# Patient Record
Sex: Male | Born: 1950 | Race: Black or African American | Hispanic: No | Marital: Married | Smoking: Never smoker
Health system: Southern US, Community
[De-identification: ages and names within clinical notes are randomized; demographics above are authoritative.]

## PROBLEM LIST (undated history)

## (undated) DIAGNOSIS — H35019 Changes in retinal vascular appearance, unspecified eye: Secondary | ICD-10-CM

## (undated) DIAGNOSIS — E278 Other specified disorders of adrenal gland: Secondary | ICD-10-CM

## (undated) DIAGNOSIS — E119 Type 2 diabetes mellitus without complications: Secondary | ICD-10-CM

## (undated) DIAGNOSIS — M5126 Other intervertebral disc displacement, lumbar region: Secondary | ICD-10-CM

## (undated) DIAGNOSIS — M129 Arthropathy, unspecified: Secondary | ICD-10-CM

## (undated) DIAGNOSIS — K5730 Diverticulosis of large intestine without perforation or abscess without bleeding: Secondary | ICD-10-CM

## (undated) DIAGNOSIS — E559 Vitamin D deficiency, unspecified: Secondary | ICD-10-CM

## (undated) DIAGNOSIS — K219 Gastro-esophageal reflux disease without esophagitis: Secondary | ICD-10-CM

## (undated) DIAGNOSIS — I152 Hypertension secondary to endocrine disorders: Secondary | ICD-10-CM

## (undated) DIAGNOSIS — E785 Hyperlipidemia, unspecified: Secondary | ICD-10-CM

## (undated) DIAGNOSIS — I509 Heart failure, unspecified: Secondary | ICD-10-CM

## (undated) DIAGNOSIS — M109 Gout, unspecified: Secondary | ICD-10-CM

## (undated) DIAGNOSIS — E669 Obesity, unspecified: Secondary | ICD-10-CM

## (undated) DIAGNOSIS — I861 Scrotal varices: Secondary | ICD-10-CM

## (undated) DIAGNOSIS — N183 Chronic kidney disease, stage 3 unspecified: Secondary | ICD-10-CM

## (undated) DIAGNOSIS — D649 Anemia, unspecified: Secondary | ICD-10-CM

## (undated) DIAGNOSIS — E269 Hyperaldosteronism, unspecified: Secondary | ICD-10-CM

## (undated) DIAGNOSIS — H908 Mixed conductive and sensorineural hearing loss, unspecified: Secondary | ICD-10-CM

## (undated) DIAGNOSIS — N400 Enlarged prostate without lower urinary tract symptoms: Secondary | ICD-10-CM

## (undated) DIAGNOSIS — M4802 Spinal stenosis, cervical region: Secondary | ICD-10-CM

## (undated) HISTORY — DX: Chronic kidney disease, stage 3 unspecified: N18.30

## (undated) HISTORY — DX: Gastro-esophageal reflux disease without esophagitis: K21.9

## (undated) HISTORY — DX: Gout, unspecified: M10.9

## (undated) HISTORY — DX: Spinal stenosis, cervical region: M48.02

## (undated) HISTORY — DX: Benign prostatic hyperplasia without lower urinary tract symptoms: N40.0

## (undated) HISTORY — DX: Other intervertebral disc displacement, lumbar region: M51.26

## (undated) HISTORY — DX: Hyperaldosteronism, unspecified: E26.9

## (undated) HISTORY — DX: Anemia, unspecified: D64.9

## (undated) HISTORY — DX: Scrotal varices: I86.1

## (undated) HISTORY — DX: Chronic kidney disease, stage 3 (moderate): N18.3

## (undated) HISTORY — DX: Mixed conductive and sensorineural hearing loss, unspecified: H90.8

## (undated) HISTORY — DX: Arthropathy, unspecified: M12.9

## (undated) HISTORY — DX: Other specified disorders of adrenal gland: E27.8

## (undated) HISTORY — DX: Hypertension secondary to endocrine disorders: I15.2

## (undated) HISTORY — DX: Vitamin D deficiency, unspecified: E55.9

## (undated) HISTORY — DX: Changes in retinal vascular appearance, unspecified eye: H35.019

## (undated) HISTORY — DX: Obesity, unspecified: E66.9

## (undated) HISTORY — DX: Diverticulosis of large intestine without perforation or abscess without bleeding: K57.30

## (undated) HISTORY — DX: Type 2 diabetes mellitus without complications: E11.9

## (undated) HISTORY — DX: Hyperlipidemia, unspecified: E78.5

---

## 1970-07-20 HISTORY — PX: TYMPANIC MEMBRANE REPAIR: SHX294

## 1987-07-21 HISTORY — PX: CARDIAC CATHETERIZATION: SHX172

## 1997-05-20 HISTORY — PX: ESOPHAGOGASTRODUODENOSCOPY: SHX1529

## 1998-02-08 ENCOUNTER — Encounter: Payer: Self-pay | Admitting: Family Medicine

## 1998-02-08 LAB — CONVERTED CEMR LAB: Blood Glucose, Fasting: 91 mg/dL

## 1998-05-24 ENCOUNTER — Ambulatory Visit (HOSPITAL_BASED_OUTPATIENT_CLINIC_OR_DEPARTMENT_OTHER): Admission: RE | Admit: 1998-05-24 | Discharge: 1998-05-24 | Payer: Self-pay | Admitting: Otolaryngology

## 1998-07-18 HISTORY — PX: FLEXIBLE SIGMOIDOSCOPY: SHX1649

## 2000-04-21 ENCOUNTER — Encounter: Payer: Self-pay | Admitting: Family Medicine

## 2000-09-07 HISTORY — PX: OTHER SURGICAL HISTORY: SHX169

## 2000-11-26 ENCOUNTER — Encounter: Payer: Self-pay | Admitting: Family Medicine

## 2000-12-07 HISTORY — PX: US ECHOCARDIOGRAPHY: HXRAD669

## 2000-12-14 ENCOUNTER — Ambulatory Visit (HOSPITAL_COMMUNITY): Admission: AD | Admit: 2000-12-14 | Discharge: 2000-12-14 | Payer: Self-pay | Admitting: Cardiology

## 2000-12-14 HISTORY — PX: CARDIAC CATHETERIZATION: SHX172

## 2001-04-29 ENCOUNTER — Ambulatory Visit (HOSPITAL_COMMUNITY): Admission: RE | Admit: 2001-04-29 | Discharge: 2001-04-29 | Payer: Self-pay | Admitting: Gastroenterology

## 2001-04-29 ENCOUNTER — Encounter: Payer: Self-pay | Admitting: Gastroenterology

## 2001-05-06 ENCOUNTER — Encounter (INDEPENDENT_AMBULATORY_CARE_PROVIDER_SITE_OTHER): Payer: Self-pay | Admitting: Gastroenterology

## 2001-05-06 HISTORY — PX: ESOPHAGOGASTRODUODENOSCOPY: SHX1529

## 2002-06-08 ENCOUNTER — Encounter: Payer: Self-pay | Admitting: Family Medicine

## 2002-06-08 LAB — CONVERTED CEMR LAB: Blood Glucose, Fasting: 101 mg/dL

## 2002-09-25 ENCOUNTER — Encounter: Payer: Self-pay | Admitting: Family Medicine

## 2002-09-25 LAB — CONVERTED CEMR LAB: Blood Glucose, Fasting: 90 mg/dL

## 2003-01-16 ENCOUNTER — Encounter: Payer: Self-pay | Admitting: Family Medicine

## 2003-01-16 LAB — CONVERTED CEMR LAB: Blood Glucose, Fasting: 90 mg/dL

## 2003-02-05 ENCOUNTER — Encounter: Payer: Self-pay | Admitting: Family Medicine

## 2003-02-05 LAB — CONVERTED CEMR LAB: Blood Glucose, Fasting: 96 mg/dL

## 2003-04-16 HISTORY — PX: OTHER SURGICAL HISTORY: SHX169

## 2003-05-16 HISTORY — PX: OTHER SURGICAL HISTORY: SHX169

## 2003-08-10 HISTORY — PX: OTHER SURGICAL HISTORY: SHX169

## 2003-09-17 ENCOUNTER — Encounter: Admission: RE | Admit: 2003-09-17 | Discharge: 2003-09-17 | Payer: Self-pay | Admitting: Specialist

## 2003-10-26 ENCOUNTER — Inpatient Hospital Stay (HOSPITAL_COMMUNITY): Admission: RE | Admit: 2003-10-26 | Discharge: 2003-10-28 | Payer: Self-pay | Admitting: Specialist

## 2003-10-26 HISTORY — PX: NECK SURGERY: SHX720

## 2004-01-17 HISTORY — PX: ROTATOR CUFF REPAIR: SHX139

## 2004-02-27 ENCOUNTER — Encounter: Payer: Self-pay | Admitting: Family Medicine

## 2004-02-27 LAB — CONVERTED CEMR LAB: Blood Glucose, Fasting: 131 mg/dL

## 2004-03-06 ENCOUNTER — Encounter: Payer: Self-pay | Admitting: Family Medicine

## 2004-03-06 LAB — CONVERTED CEMR LAB
Blood Glucose, Fasting: 118 mg/dL
Hgb A1c MFr Bld: 6.6 %

## 2004-04-08 ENCOUNTER — Encounter: Payer: Self-pay | Admitting: Family Medicine

## 2004-05-02 ENCOUNTER — Encounter: Payer: Self-pay | Admitting: Family Medicine

## 2004-05-02 LAB — CONVERTED CEMR LAB
Blood Glucose, Fasting: 103 mg/dL
PSA: 1.2 ng/mL
WBC, blood: 6.2 10*3/uL

## 2004-06-16 ENCOUNTER — Ambulatory Visit: Payer: Self-pay | Admitting: Family Medicine

## 2004-09-04 ENCOUNTER — Ambulatory Visit: Payer: Self-pay | Admitting: Family Medicine

## 2004-09-04 LAB — CONVERTED CEMR LAB
Blood Glucose, Fasting: 106 mg/dL
PSA: 1.13 ng/mL

## 2004-09-08 ENCOUNTER — Ambulatory Visit: Payer: Self-pay | Admitting: Family Medicine

## 2004-10-13 ENCOUNTER — Ambulatory Visit: Payer: Self-pay | Admitting: Family Medicine

## 2004-11-20 ENCOUNTER — Ambulatory Visit: Payer: Self-pay | Admitting: Family Medicine

## 2005-03-09 ENCOUNTER — Ambulatory Visit: Payer: Self-pay | Admitting: Family Medicine

## 2005-09-24 ENCOUNTER — Ambulatory Visit: Payer: Self-pay | Admitting: Family Medicine

## 2005-10-19 ENCOUNTER — Ambulatory Visit: Payer: Self-pay | Admitting: Internal Medicine

## 2005-10-19 ENCOUNTER — Encounter: Payer: Self-pay | Admitting: Family Medicine

## 2005-10-19 LAB — CONVERTED CEMR LAB: Blood Glucose, Fasting: 106 mg/dL

## 2005-10-19 IMAGING — CR DG CHEST 2V
2 series · 2 of 2 positions shown · non-contrast
Comparison: none

CLINICAL DATA: Pre-operative respiratory exam for foraminal stenosis. 
 TWO VIEW CHEST 
 The heart and mediastinum are normal except for early calcification in the thoracic aorta. The lungs are clear.   No effusions. No significant soft tissue or bony finding.  Minimal degenerative changes affect the thoracic spine. 
 IMPRESSION
 No active disease.

[view not recorded (1 of 2)]
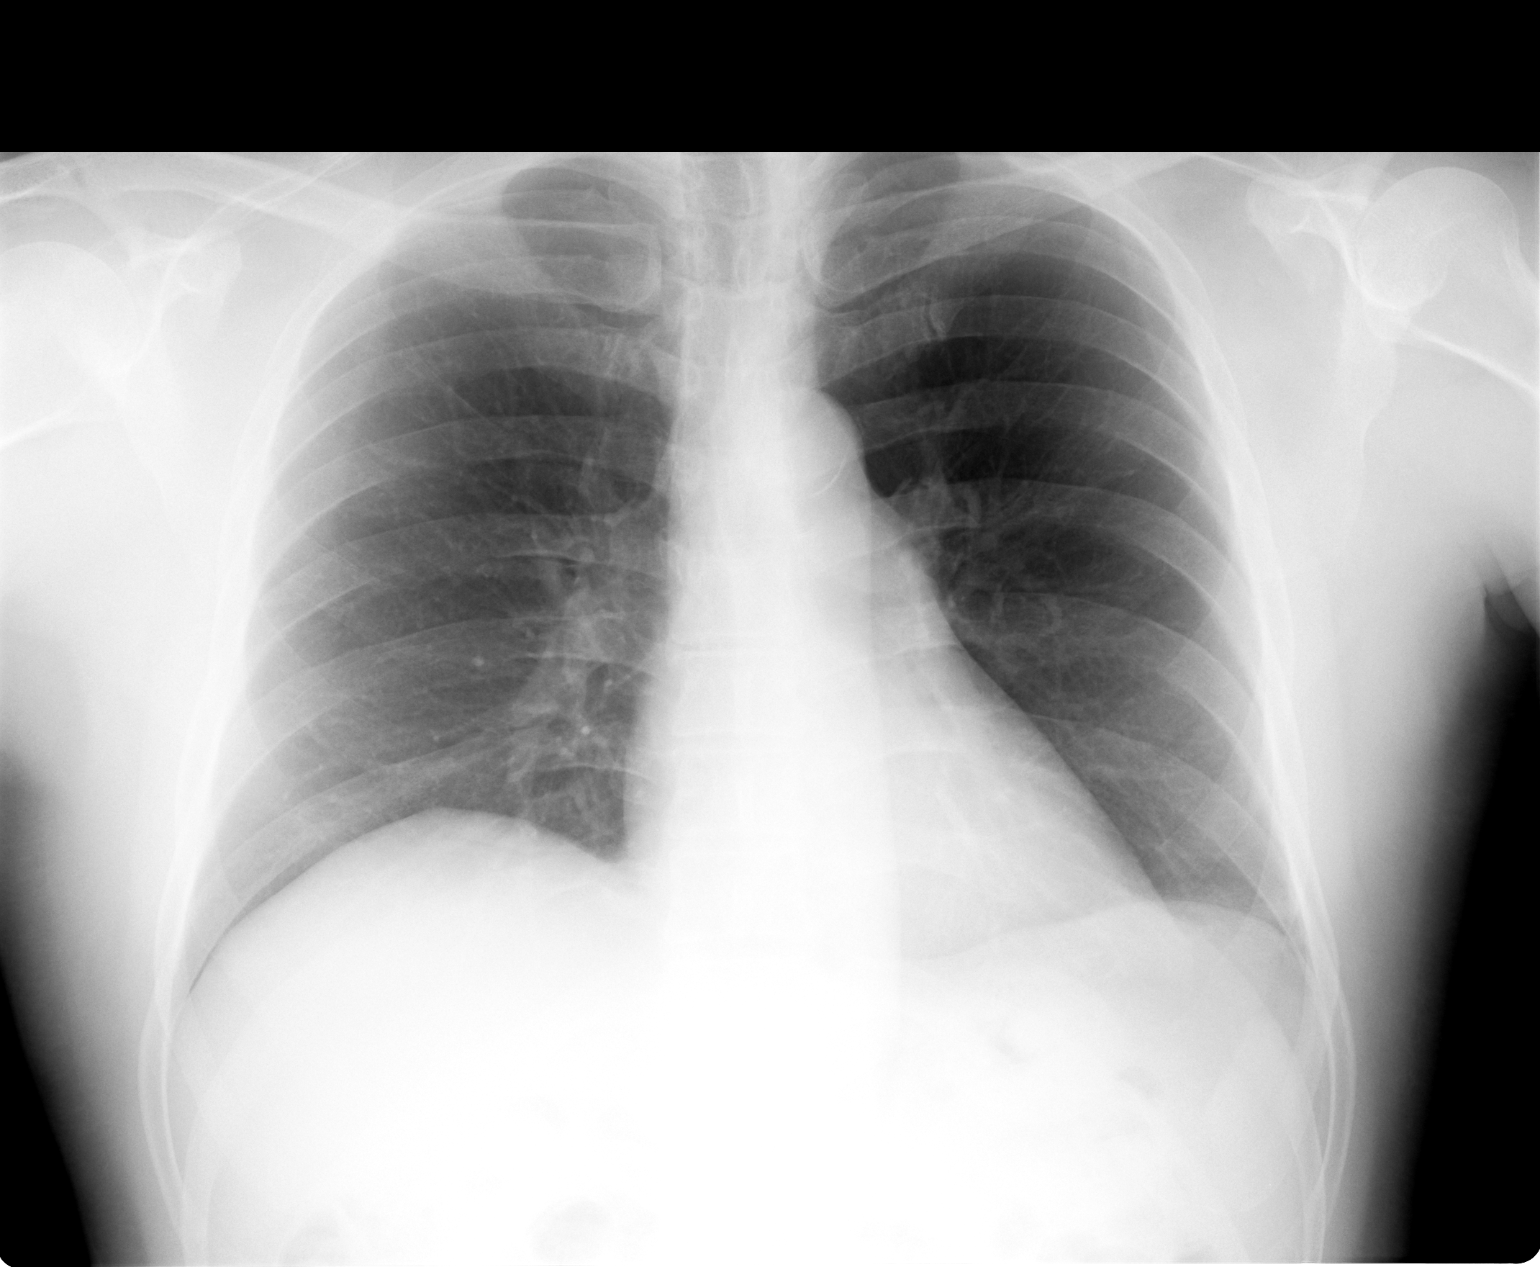

[view not recorded (2 of 2)]
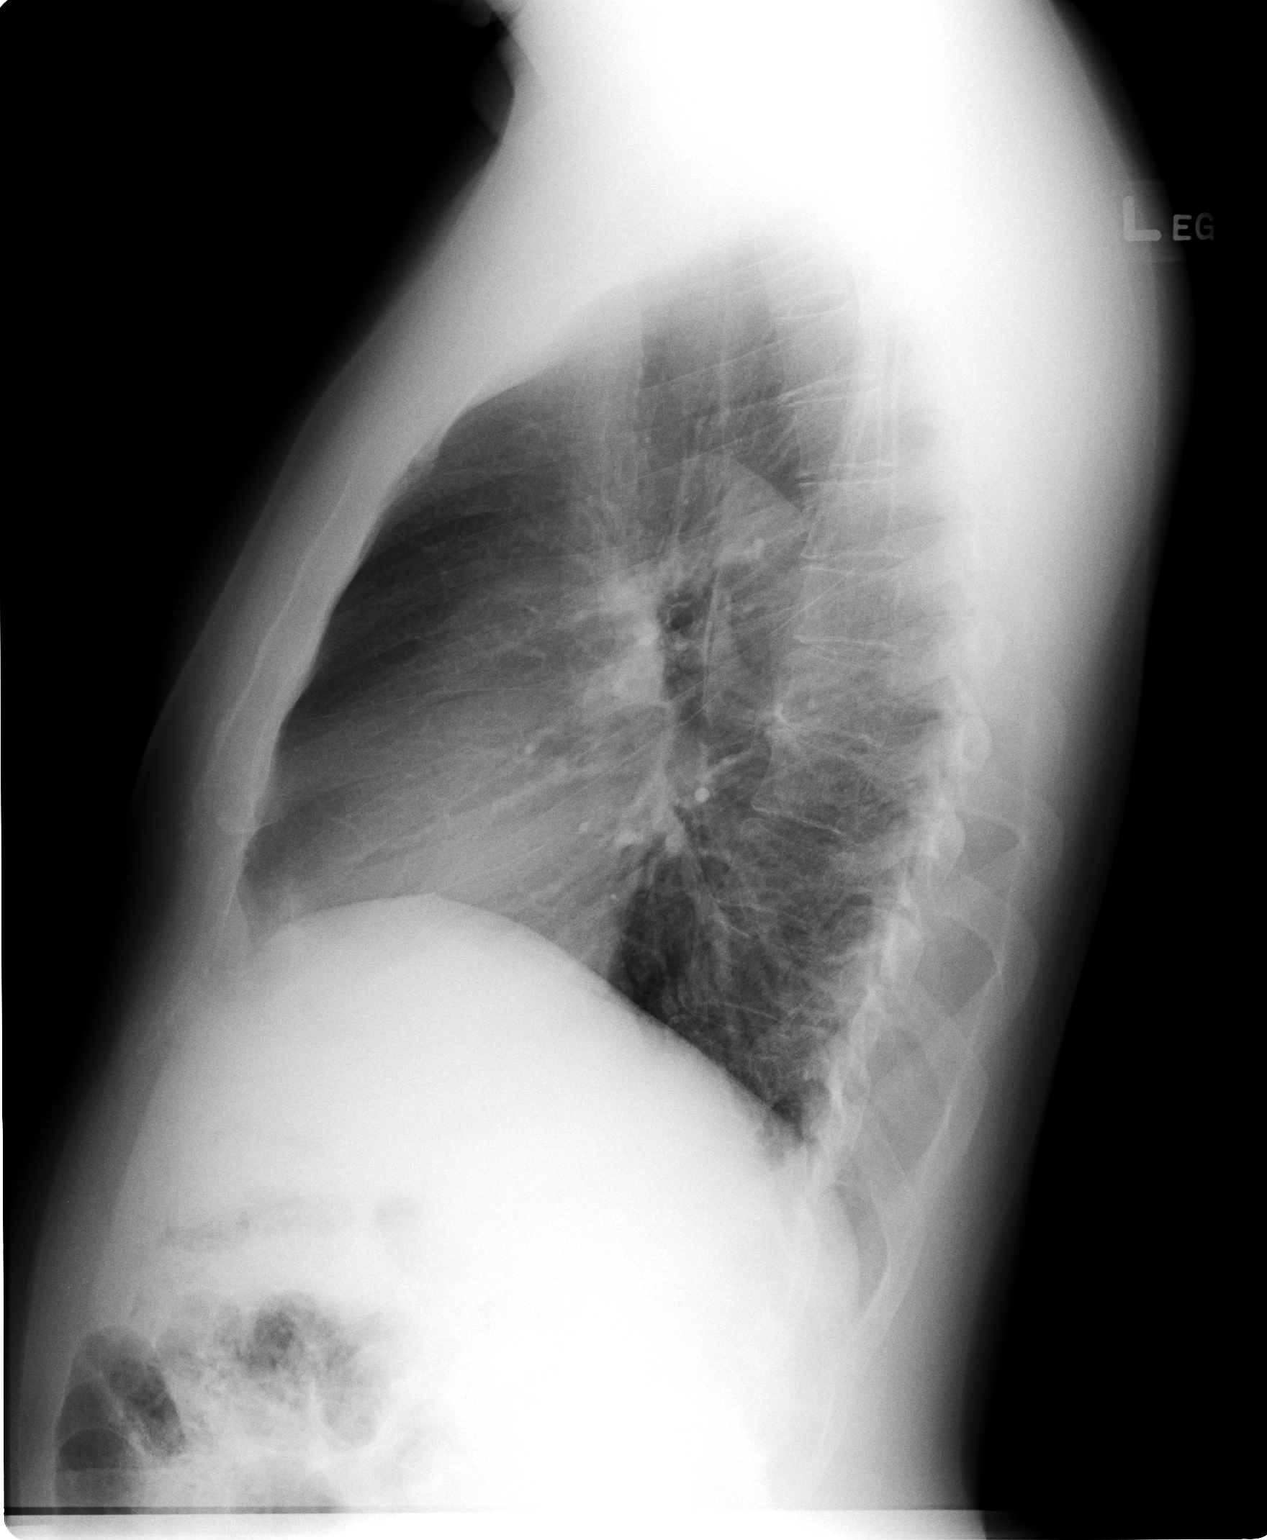

[2 of 2 positions shown; findings below may reference images not displayed]

## 2005-10-21 ENCOUNTER — Ambulatory Visit: Payer: Self-pay | Admitting: Cardiology

## 2005-10-21 HISTORY — PX: CARDIOVASCULAR STRESS TEST: SHX262

## 2005-10-28 ENCOUNTER — Ambulatory Visit: Payer: Self-pay | Admitting: Internal Medicine

## 2005-11-03 ENCOUNTER — Ambulatory Visit: Payer: Self-pay

## 2005-11-03 ENCOUNTER — Encounter: Payer: Self-pay | Admitting: Cardiology

## 2005-11-10 ENCOUNTER — Ambulatory Visit: Payer: Self-pay | Admitting: Internal Medicine

## 2005-11-16 ENCOUNTER — Ambulatory Visit: Payer: Self-pay | Admitting: Internal Medicine

## 2005-11-16 ENCOUNTER — Encounter: Payer: Self-pay | Admitting: Family Medicine

## 2005-11-17 ENCOUNTER — Ambulatory Visit (HOSPITAL_COMMUNITY): Admission: RE | Admit: 2005-11-17 | Discharge: 2005-11-17 | Payer: Self-pay | Admitting: Internal Medicine

## 2005-11-17 ENCOUNTER — Ambulatory Visit: Payer: Self-pay | Admitting: Internal Medicine

## 2005-12-03 ENCOUNTER — Ambulatory Visit: Payer: Self-pay | Admitting: Family Medicine

## 2005-12-17 ENCOUNTER — Ambulatory Visit: Payer: Self-pay | Admitting: Family Medicine

## 2005-12-18 ENCOUNTER — Ambulatory Visit: Payer: Self-pay | Admitting: Internal Medicine

## 2005-12-30 ENCOUNTER — Ambulatory Visit: Payer: Self-pay | Admitting: Internal Medicine

## 2006-01-08 ENCOUNTER — Ambulatory Visit: Payer: Self-pay | Admitting: Internal Medicine

## 2006-05-04 ENCOUNTER — Encounter: Payer: Self-pay | Admitting: Family Medicine

## 2006-05-04 LAB — CONVERTED CEMR LAB
Blood Glucose, Fasting: 86 mg/dL
Hgb A1c MFr Bld: 5.3 %

## 2006-05-06 ENCOUNTER — Ambulatory Visit: Payer: Self-pay | Admitting: Internal Medicine

## 2006-12-20 ENCOUNTER — Encounter: Payer: Self-pay | Admitting: Family Medicine

## 2006-12-20 LAB — CONVERTED CEMR LAB: Blood Glucose, Fasting: 101 mg/dL

## 2007-04-04 ENCOUNTER — Telehealth (INDEPENDENT_AMBULATORY_CARE_PROVIDER_SITE_OTHER): Payer: Self-pay | Admitting: *Deleted

## 2007-06-08 ENCOUNTER — Encounter: Payer: Self-pay | Admitting: Family Medicine

## 2007-06-21 ENCOUNTER — Encounter: Payer: Self-pay | Admitting: Family Medicine

## 2007-06-21 DIAGNOSIS — N259 Disorder resulting from impaired renal tubular function, unspecified: Secondary | ICD-10-CM | POA: Insufficient documentation

## 2007-06-22 DIAGNOSIS — E785 Hyperlipidemia, unspecified: Secondary | ICD-10-CM | POA: Insufficient documentation

## 2007-06-22 DIAGNOSIS — K5730 Diverticulosis of large intestine without perforation or abscess without bleeding: Secondary | ICD-10-CM | POA: Insufficient documentation

## 2007-06-22 DIAGNOSIS — H908 Mixed conductive and sensorineural hearing loss, unspecified: Secondary | ICD-10-CM | POA: Insufficient documentation

## 2007-06-22 DIAGNOSIS — E1122 Type 2 diabetes mellitus with diabetic chronic kidney disease: Secondary | ICD-10-CM | POA: Insufficient documentation

## 2007-06-22 DIAGNOSIS — K219 Gastro-esophageal reflux disease without esophagitis: Secondary | ICD-10-CM | POA: Insufficient documentation

## 2007-06-22 DIAGNOSIS — M129 Arthropathy, unspecified: Secondary | ICD-10-CM | POA: Insufficient documentation

## 2007-06-22 DIAGNOSIS — E119 Type 2 diabetes mellitus without complications: Secondary | ICD-10-CM | POA: Insufficient documentation

## 2007-06-22 DIAGNOSIS — N400 Enlarged prostate without lower urinary tract symptoms: Secondary | ICD-10-CM | POA: Insufficient documentation

## 2007-06-22 DIAGNOSIS — D649 Anemia, unspecified: Secondary | ICD-10-CM | POA: Insufficient documentation

## 2007-06-22 DIAGNOSIS — E278 Other specified disorders of adrenal gland: Secondary | ICD-10-CM | POA: Insufficient documentation

## 2007-06-22 DIAGNOSIS — R0609 Other forms of dyspnea: Secondary | ICD-10-CM | POA: Insufficient documentation

## 2007-06-22 DIAGNOSIS — R0989 Other specified symptoms and signs involving the circulatory and respiratory systems: Secondary | ICD-10-CM | POA: Insufficient documentation

## 2007-06-22 DIAGNOSIS — H35019 Changes in retinal vascular appearance, unspecified eye: Secondary | ICD-10-CM | POA: Insufficient documentation

## 2007-06-30 ENCOUNTER — Telehealth (INDEPENDENT_AMBULATORY_CARE_PROVIDER_SITE_OTHER): Payer: Self-pay | Admitting: *Deleted

## 2007-10-19 ENCOUNTER — Telehealth: Payer: Self-pay | Admitting: Family Medicine

## 2007-11-01 ENCOUNTER — Emergency Department (HOSPITAL_COMMUNITY): Admission: EM | Admit: 2007-11-01 | Discharge: 2007-11-01 | Payer: Self-pay | Admitting: Family Medicine

## 2007-11-03 ENCOUNTER — Encounter: Payer: Self-pay | Admitting: Family Medicine

## 2007-11-09 ENCOUNTER — Encounter: Admission: RE | Admit: 2007-11-09 | Discharge: 2007-11-09 | Payer: Self-pay | Admitting: Orthopedic Surgery

## 2007-11-16 ENCOUNTER — Encounter: Payer: Self-pay | Admitting: Family Medicine

## 2007-11-16 HISTORY — PX: OTHER SURGICAL HISTORY: SHX169

## 2007-11-23 ENCOUNTER — Encounter: Payer: Self-pay | Admitting: Family Medicine

## 2007-11-24 ENCOUNTER — Encounter: Payer: Self-pay | Admitting: Family Medicine

## 2007-11-25 ENCOUNTER — Encounter: Admission: RE | Admit: 2007-11-25 | Discharge: 2007-11-25 | Payer: Self-pay | Admitting: Orthopedic Surgery

## 2008-04-12 ENCOUNTER — Telehealth: Payer: Self-pay | Admitting: Family Medicine

## 2008-05-09 ENCOUNTER — Encounter: Payer: Self-pay | Admitting: Family Medicine

## 2008-07-09 ENCOUNTER — Ambulatory Visit: Payer: Self-pay | Admitting: Family Medicine

## 2008-07-09 LAB — CONVERTED CEMR LAB
Albumin: 4.1 g/dL (ref 3.5–5.2)
Alkaline Phosphatase: 72 units/L (ref 39–117)
Bilirubin, Direct: 0.1 mg/dL (ref 0.0–0.3)
Calcium: 9.4 mg/dL (ref 8.4–10.5)
Cholesterol: 157 mg/dL (ref 0–200)
Eosinophils Absolute: 0.2 10*3/uL (ref 0.0–0.7)
GFR calc Af Amer: 57 mL/min
GFR calc non Af Amer: 47 mL/min
Glucose, Bld: 94 mg/dL (ref 70–99)
HCT: 44.1 % (ref 39.0–52.0)
HDL: 49.9 mg/dL (ref >39.0)
Hemoglobin: 15.5 g/dL (ref 13.0–17.0)
Hgb A1c MFr Bld: 5.7 % (ref 4.6–6.0)
MCHC: 35.2 g/dL (ref 30.0–36.0)
MCV: 85.5 fL (ref 78.0–100.0)
Microalb Creat Ratio: 50.7 mg/g — ABNORMAL HIGH (ref 0.0–30.0)
Microalb, Ur: 6.8 mg/dL — ABNORMAL HIGH (ref 0.0–1.9)
Monocytes Absolute: 0.3 10*3/uL (ref 0.1–1.0)
Monocytes Relative: 7.5 % (ref 3.0–12.0)
Neutro Abs: 2.3 10*3/uL (ref 1.4–7.7)
Platelets: 178 10*3/uL (ref 150–400)
Potassium: 3.6 meq/L (ref 3.5–5.1)
RDW: 12.4 % (ref 11.5–14.6)
Sodium: 142 meq/L (ref 135–145)
Total CHOL/HDL Ratio: 3.1
Total Protein: 6.6 g/dL (ref 6.0–8.3)
Triglycerides: 41 mg/dL (ref 0–149)

## 2008-07-17 ENCOUNTER — Ambulatory Visit: Payer: Self-pay | Admitting: Family Medicine

## 2008-07-17 DIAGNOSIS — I151 Hypertension secondary to other renal disorders: Secondary | ICD-10-CM

## 2008-07-17 DIAGNOSIS — E2609 Other primary hyperaldosteronism: Secondary | ICD-10-CM | POA: Insufficient documentation

## 2008-08-08 ENCOUNTER — Ambulatory Visit: Payer: Self-pay | Admitting: Gastroenterology

## 2008-10-25 ENCOUNTER — Encounter: Admission: RE | Admit: 2008-10-25 | Discharge: 2008-10-25 | Payer: Self-pay | Admitting: Specialist

## 2009-01-11 ENCOUNTER — Ambulatory Visit: Payer: Self-pay | Admitting: Family Medicine

## 2009-01-11 LAB — CONVERTED CEMR LAB
BUN: 23 mg/dL (ref 6–23)
Chloride: 110 meq/L (ref 96–112)
Creatinine, Ser: 1.4 mg/dL (ref 0.4–1.5)
Glucose, Bld: 123 mg/dL — ABNORMAL HIGH (ref 70–99)
Potassium: 3.4 meq/L — ABNORMAL LOW (ref 3.5–5.1)

## 2009-01-16 ENCOUNTER — Ambulatory Visit: Payer: Self-pay | Admitting: Family Medicine

## 2009-01-16 DIAGNOSIS — E876 Hypokalemia: Secondary | ICD-10-CM | POA: Insufficient documentation

## 2009-01-17 LAB — CONVERTED CEMR LAB
BUN: 21 mg/dL (ref 6–23)
CO2: 29 meq/L (ref 19–32)
Calcium: 9.5 mg/dL (ref 8.4–10.5)
Chloride: 106 meq/L (ref 96–112)
Creatinine, Ser: 1.4 mg/dL (ref 0.4–1.5)
Glucose, Bld: 96 mg/dL (ref 70–99)

## 2009-02-22 ENCOUNTER — Ambulatory Visit: Payer: Self-pay | Admitting: Gastroenterology

## 2009-06-03 ENCOUNTER — Encounter: Payer: Self-pay | Admitting: Family Medicine

## 2009-07-11 ENCOUNTER — Ambulatory Visit: Payer: Self-pay | Admitting: Family Medicine

## 2009-07-11 DIAGNOSIS — M1990 Unspecified osteoarthritis, unspecified site: Secondary | ICD-10-CM | POA: Insufficient documentation

## 2009-07-13 LAB — CONVERTED CEMR LAB
BUN: 28 mg/dL — ABNORMAL HIGH (ref 6–23)
Basophils Relative: 0 % (ref 0–1)
CO2: 23 meq/L (ref 19–32)
Eosinophils Absolute: 0.1 10*3/uL (ref 0.0–0.7)
Eosinophils Relative: 3 % (ref 0–5)
Glucose, Bld: 94 mg/dL (ref 70–99)
HCT: 43.3 % (ref 39.0–52.0)
Hemoglobin: 15.1 g/dL (ref 13.0–17.0)
Iron: 96 ug/dL (ref 42–165)
MCHC: 34.9 g/dL (ref 30.0–36.0)
MCV: 82.2 fL (ref 78.0–100.0)
Monocytes Absolute: 0.4 10*3/uL (ref 0.1–1.0)
Monocytes Relative: 8 % (ref 3–12)
Neutro Abs: 2.9 10*3/uL (ref 1.7–7.7)
PSA: 2.9 ng/mL (ref 0.10–4.00)
RBC: 5.27 M/uL (ref 4.22–5.81)
Sodium: 142 meq/L (ref 135–145)
TSH: 0.75 microintl units/mL (ref 0.350–4.500)
Total Bilirubin: 0.5 mg/dL (ref 0.3–1.2)
Total Protein: 6.7 g/dL (ref 6.0–8.3)

## 2009-07-18 ENCOUNTER — Ambulatory Visit: Payer: Self-pay | Admitting: Family Medicine

## 2009-07-18 DIAGNOSIS — E559 Vitamin D deficiency, unspecified: Secondary | ICD-10-CM | POA: Insufficient documentation

## 2009-07-29 ENCOUNTER — Telehealth: Payer: Self-pay | Admitting: Family Medicine

## 2009-08-01 ENCOUNTER — Encounter: Payer: Self-pay | Admitting: Family Medicine

## 2009-08-07 ENCOUNTER — Encounter: Payer: Self-pay | Admitting: Family Medicine

## 2009-08-07 LAB — CONVERTED CEMR LAB
HDL: 42 mg/dL (ref >39)
LDL Cholesterol: 135 mg/dL — ABNORMAL HIGH (ref 0–99)
Total CHOL/HDL Ratio: 4.5

## 2009-08-09 ENCOUNTER — Telehealth: Payer: Self-pay | Admitting: Family Medicine

## 2009-08-15 ENCOUNTER — Ambulatory Visit: Payer: Self-pay | Admitting: Family Medicine

## 2009-08-15 LAB — CONVERTED CEMR LAB: OCCULT 1: NEGATIVE

## 2009-08-19 ENCOUNTER — Encounter (INDEPENDENT_AMBULATORY_CARE_PROVIDER_SITE_OTHER): Payer: Self-pay | Admitting: *Deleted

## 2009-10-14 ENCOUNTER — Ambulatory Visit: Payer: Self-pay | Admitting: Family Medicine

## 2009-10-17 ENCOUNTER — Ambulatory Visit: Payer: Self-pay | Admitting: Family Medicine

## 2009-11-20 ENCOUNTER — Telehealth: Payer: Self-pay | Admitting: Family Medicine

## 2010-01-13 ENCOUNTER — Ambulatory Visit: Payer: Self-pay | Admitting: Family Medicine

## 2010-01-16 ENCOUNTER — Ambulatory Visit: Payer: Self-pay | Admitting: Family Medicine

## 2010-02-25 ENCOUNTER — Encounter (INDEPENDENT_AMBULATORY_CARE_PROVIDER_SITE_OTHER): Payer: Self-pay | Admitting: *Deleted

## 2010-06-02 ENCOUNTER — Encounter: Payer: Self-pay | Admitting: Family Medicine

## 2010-08-21 NOTE — Assessment & Plan Note (Signed)
Summary: F/U AFTER LABS / LFW   Vital Signs:  Patient profile:   60 year old male Weight:      183 pounds Temp:     98.1 degrees F oral Pulse rate:   60 / minute Pulse rhythm:   regular BP sitting:   118 / 72  (right arm) Cuff size:   large  Vitals Entered By: Sydell Axon LPN (January 16, 2010 10:45 AM) CC: 3 Month follow-up after labs   History of Present Illness: Pt here for three month followup, hasn't taken any Vit D for a while, did not start OTC Vit D yet asnd was 46 on Weekly.  He has been on healthy diet latley and has lost significant weight. His sugar control has been coerrespondingly great!! He is on no meds. He has no complaints today and feels well except for excessively dry lips.  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 1 By Mouth Daily 2)  Diltiazem Hcl Cr 240 Mg  Cp24 (Diltiazem Hcl) .Marland Kitchen.. 1 By Mouth Daily 3)  Lisinopril 20 Mg  Tabs (Lisinopril) .Marland Kitchen.. 1 By Mouth Two Times A Day 4)  Hydrochlorothiazide 25 Mg  Tabs (Hydrochlorothiazide) .... 1/2  Tablet By Mouth Daily 5)  Viagra 100 Mg  Tabs (Sildenafil Citrate) .Marland Kitchen.. 1  Tab By Mouth 1hr Prior 6)  Adult Aspirin Low Strength 81 Mg  Tbdp (Aspirin) .... Take One By Mouth Daily 7)  Multivitamins   Tabs (Multiple Vitamin) .Marland Kitchen.. 1 By Mouth Daily 8)  Tylenol 9)  Inspra 50 Mg Tabs (Eplerenone) .... 1/2 Tablet Daily By Mouth 10)  Ergocalciferol 50000 Unit Caps (Ergocalciferol) .... One Tab By Mouth Once A Week 11)  Crestor 20 Mg Tabs (Rosuvastatin Calcium) .... Take One By Mouth Daily  Allergies: 1)  ! * Dillaudid 2)  ! Oxycodone Hcl  Physical Exam  General:  Well-developed,well-nourished,in no acute distress; alert,appropriate and cooperative throughout examination, looks good, thinner. Head:  Normocephalic and atraumatic without obvious abnormalities. No apparent alopecia, mild thinning.  Sinuises NT. Eyes:  Conjunctiva clear bilaterally.  Ears:  External ear exam shows no significant lesions or deformities.  Otoscopic examination reveals clear canals, tympanic membranes are intact bilaterally without bulging, retraction, inflammation or discharge. Hearing is grossly normal bilaterally. Nose:  External nasal examination shows no deformity or inflammation. Nasal mucosa are pink and moist without lesions or exudates. Mouth:  Oral mucosa and oropharynx without lesions or exudates.  Teeth in good repair. Neck:  No deformities, masses, or tenderness noted. Lungs:  Normal respiratory effort, chest expands symmetrically. Lungs are clear to auscultation, no crackles or wheezes. Heart:  Normal rate and regular rhythm. S1 and S2 normal without gallop, murmur, click, rub or other extra sounds.   Impression & Recommendations:  Problem # 1:  VITAMIN D DEFICIENCY (ICD-268.9) Assessment Deteriorated Lvl has gone down but still therapeutic. Start Vit D 1000Iu two times a day. Recheck in future.  Problem # 2:  DIABETES MELLITUS, TYPE II (ICD-250.00) Assessment: Improved Great job. Cont curr  lifestyle. His updated medication list for this problem includes:    Lisinopril 20 Mg Tabs (Lisinopril) .Marland Kitchen... 1 by mouth two times a day    Adult Aspirin Low Strength 81 Mg Tbdp (Aspirin) .Marland Kitchen... Take one by mouth daily  Labs Reviewed: Creat: 1.52 (07/11/2009)    Reviewed HgBA1c results: 5.6 (01/13/2010)  6.1 (07/11/2009)  Complete Medication List: 1)  Nexium 40 Mg Cpdr (Esomeprazole magnesium) .Marland Kitchen.. 1 by mouth daily 2)  Diltiazem Hcl Cr 240 Mg Cp24 (Diltiazem hcl) .Marland Kitchen.. 1 by mouth daily 3)  Lisinopril 20 Mg Tabs (Lisinopril) .Marland Kitchen.. 1 by mouth two times a day 4)  Hydrochlorothiazide 25 Mg Tabs (Hydrochlorothiazide) .... 1/2 tablet by mouth daily 5)  Viagra 100 Mg Tabs (Sildenafil citrate) .Marland Kitchen.. 1  tab by mouth 1hr prior 6)  Adult Aspirin Low Strength 81 Mg Tbdp (Aspirin) .... Take one by mouth daily 7)  Multivitamins Tabs (Multiple vitamin) .Marland Kitchen.. 1 by mouth daily 8)  Tylenol  9)  Inspra 50 Mg Tabs (Eplerenone) .... 1/2 tablet daily by mouth 10)  Ergocalciferol 50000 Unit Caps (Ergocalciferol) .... One tab by mouth once a week 11)  Crestor 20 Mg Tabs (Rosuvastatin calcium) .... Take one by mouth daily  Patient Instructions: 1)  RTC end of Dec/beginning of Jan for Comp Exam, sooner as needed.  Current Allergies (reviewed today): ! * DILLAUDID ! OXYCODONE HCL

## 2010-08-21 NOTE — Letter (Signed)
Summary: [facility]  [facility]   Imported By: Lanelle Bal 06/19/2010 11:59:39  _____________________________________________________________________  External Attachment:    Type:   Image     Comment:   External Document

## 2010-08-21 NOTE — Progress Notes (Signed)
Summary: refill on meds   Phone Note Refill Request Call back at [phone] Message [facility]:  [facility] on July 29, 2009 10:44 AM  Refills Requested: Medication #1:  NEXIUM 40 MG  CPDR 1 by mouth daily  Medication #2:  CRESTOR 10 MG  TABS 1 by mouth daily   Last Refilled: 04/20/2009  Medication #3:  INSPRA 50 MG TABS 1/2 tablet daily by mouth  Medication #4:  LISINOPRIL 20 MG  TABS 1 by mouth two times a day   Last Refilled: 04/20/2009 DILTIAZEM HCL ER 240 MG  CP24 (DILTIAZEM HCL) 1 by mouth daily  #90 x 3   Hydrochlorothiazide 25 Mg Tabs (Hydrochlorothiazide) .Marland Kitchen... 1/2 tablet by mouth daily [facility] [address] [address]    Method Requested: Electronic Initial call taken by: Melody Comas,  July 29, 2009 9:48 AM    Prescriptions: HYDROCHLOROTHIAZIDE 25 MG  TABS (HYDROCHLOROTHIAZIDE) 1/2 TABLET by mouth daily  #30 x 0   Entered by:   Ruthe Mannan MD   Authorized by:   Shaune Leeks MD   Signed by:   Ruthe Mannan MD on 07/29/2009   Method used:   Electronically to        [facility] [phone]* (retail)       [address] [place], [place]  [address]       Ph: [phone]       Fax: [phone]   RxID:   [id number] LISINOPRIL 20 MG  TABS (LISINOPRIL) 1 by mouth two times a day  #60 x 0   Entered by:   Ruthe Mannan MD   Authorized by:   Shaune Leeks MD   Signed by:   Ruthe Mannan MD on 07/29/2009   Method used:   Electronically to        [id number] (retail)       [address] [place], [place]  [address]       Ph: [phone]       Fax: [phone]   RxID:   [id number] DILTIAZEM HCL CR 240 MG  CP24 (DILTIAZEM HCL) 1 by mouth daily  #90 x 0   Entered by:   Ruthe Mannan MD   Authorized by:   Shaune Leeks MD   Signed by:   Ruthe Mannan MD on 07/29/2009   Method used:   Electronically to        [facility] [address] (retail)       [address] [place], [place]  [address]       Ph:  [phone]       Fax: [phone]   RxID:   [id number]

## 2010-08-21 NOTE — Progress Notes (Signed)
Summary: Rx Vitamin D  Phone Note Refill Request Call back at [phone] Message [facility]:  [facility] on Nov 20, 2009 9:50 AM  RECEIVED REFILL REQUEST FOR VITMAIN D 50.000.  LOOKS LIKE THIS MEDICATION WAS STOPPED AT OFFICE VISIT ON 10/17/09 AND SWITCHED TO OTC.  Please advise.  Initial call taken by: Sydell Axon LPN,  Nov 21, 4538 9:51 AM  Follow-up for Phone Call        This was indeed stopped. He is to be taking Vit D OTC 1000Iu once a day. Follow-up by: Shaune Leeks MD,  Nov 20, 2009 11:31 AM  Additional Follow-up for Phone Call Additional follow up Details #1::        Pharmacy notified as instructed. Additional Follow-up by: Sydell Axon LPN,  Nov 20, 9809 12:31 PM

## 2010-08-21 NOTE — Assessment & Plan Note (Signed)
Summary: 3 MONTH FOLLOW UP/[facility]   Vital Signs:  Patient profile:   60 year old male Weight:      192 pounds Temp:     98.4 degrees F oral Pulse rate:   60 / minute Pulse rhythm:   regular BP sitting:   120 / 76  (left arm) Cuff size:   regular  Vitals Entered By: Sydell Axon LPN (October 17, 2009 9:19 AM) CC: 3 Month follow-up after labs   History of Present Illness: Pt hwere for three month followup for checking Vit D replacement. He has tolerated the weekly dose without difficulty.  He has no complaints today and feels well. He spent yesterday at the [place] trying to get organized for a colonoscopy. Attitudes there amazed him.  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 1 By Mouth Daily 2)  Diltiazem Hcl Cr 240 Mg  Cp24 (Diltiazem Hcl) .Marland Kitchen.. 1 By Mouth Daily 3)  Lisinopril 20 Mg  Tabs (Lisinopril) .Marland Kitchen.. 1 By Mouth Two Times A Day 4)  Hydrochlorothiazide 25 Mg  Tabs (Hydrochlorothiazide) .... 1/2 Tablet By Mouth Daily 5)  Viagra 100 Mg  Tabs (Sildenafil Citrate) .Marland Kitchen.. 1   Tab By Mouth 1hr Prior 6)  Adult Aspirin Low Strength 81 Mg  Tbdp (Aspirin) .... Take One By Mouth Daily 7)  Multivitamins   Tabs (Multiple Vitamin) .Marland Kitchen.. 1 By Mouth Daily 8)  Tylenol 9)  Inspra 50 Mg Tabs (Eplerenone) .... 1/2 Tablet Daily By Mouth 10)  Ergocalciferol 50000 Unit Caps (Ergocalciferol) .... One Tab By Mouth Once A Week 11)  Crestor 20 Mg Tabs (Rosuvastatin Calcium) .... Take One By Mouth Daily  Allergies: 1)  ! * Dillaudid 2)  ! Oxycodone Hcl  Physical Exam  General:  Well-developed,well-nourished,in no acute distress; alert,appropriate and cooperative throughout examination, looks good, thinner. Head:  Normocephalic and atraumatic without obvious abnormalities. No apparent alopecia, mild thinning.  Eyes:  Conjunctiva clear bilaterally.  Ears:  External ear exam shows no significant lesions or deformities.  Otoscopic examination reveals clear canals, tympanic membranes are intact bilaterally without bulging, retraction, inflammation or discharge. Hearing is grossly normal bilaterally. Nose:  External nasal examination shows no deformity or inflammation. Nasal mucosa are pink and moist without lesions or exudates. Mouth:  Oral mucosa and oropharynx without lesions or exudates.  Teeth in good repair. Neck:  No deformities, masses, or tenderness noted. Lungs:  Normal respiratory effort, chest expands symmetrically. Lungs are clear to auscultation, no crackles or wheezes. Heart:  Normal rate and regular rhythm. S1 and S2 normal without gallop, murmur, click, rub or other extra sounds.   Impression &  Recommendations:  Problem # 1:  VITAMIN D DEFICIENCY (ICD-268.9) Assessment Improved Lvl from 12 to 46 in 3 mos of 50000Iu repl.  Change to OTC 1000Iu daily.  Problem # 2:  DIABETES MELLITUS, TYPE II (ICD-250.00) Assessment: Unchanged  Will recheck next time. His updated medication list for this problem includes:    Lisinopril 20 Mg Tabs (Lisinopril) .Marland Kitchen... 1 by mouth two  times a day    Adult Aspirin Low Strength 81 Mg Tbdp (Aspirin) .Marland Kitchen... Take one by mouth daily  Labs Reviewed: Creat: 1.52 (07/11/2009)    Reviewed HgBA1c results: 6.1 (07/11/2009)  5.9 (01/11/2009)  Problem # 3:  HEALTH MAINTENANCE EXAM (ICD-V70.0) Assessment: Comment Only Sickle Cell screen neg.  Complete Medication List: 1)  Nexium 40 Mg Cpdr (Esomeprazole magnesium) .Marland Kitchen.. 1 by mouth daily 2)  Diltiazem Hcl Cr 240 Mg Cp24 (Diltiazem hcl) .Marland Kitchen.. 1 by mouth daily 3)  Lisinopril 20 Mg Tabs (Lisinopril) .Marland Kitchen.. 1 by mouth two times a day 4)  Hydrochlorothiazide 25 Mg Tabs (Hydrochlorothiazide) .... 1/2 tablet by mouth daily 5)  Viagra 100 Mg Tabs (Sildenafil citrate) .Marland Kitchen.. 1  tab by mouth 1hr prior 6)  Adult Aspirin Low Strength 81 Mg Tbdp (Aspirin) .... Take one by mouth daily 7)  Multivitamins Tabs (Multiple vitamin) .Marland Kitchen.. 1 by mouth daily 8)  Tylenol  9)  Inspra 50 Mg Tabs (Eplerenone) .... 1/2 tablet daily by mouth 10)  Ergocalciferol 50000 Unit Caps (Ergocalciferol) .... One tab by mouth once a week 11)  Crestor 20 Mg Tabs (Rosuvastatin calcium) .... Take one by mouth daily  Patient Instructions: 1)  RTC 3 mos Vit D lvl 286.9. Will also check A1C. 250.00 2)  Start Vit D OTC 1000Iu daily. Prescriptions: LISINOPRIL 20 MG  TABS (LISINOPRIL) 1 by mouth two times a day  #90 x 3   Entered by:   Sydell Axon LPN   Authorized by:   Shaune Leeks MD   Signed by:   Sydell Axon LPN on 16/04/9603   Method used:   [facility] [phone][facility] (retail)       [address] [place], [place]  [address]       Ph: [phone]       Fax: [phone]   RxID:   [id number]   Current Allergies (reviewed today): ! * DILLAUDID ! OXYCODONE HCL

## 2010-08-21 NOTE — Letter (Signed)
Summary: Karl Robinson letter  [place] at [facility]  [address] [place], [place] [address]   Phone: [phone]  Fax: [phone]       02/25/2010 MRN: [id number]  Karl Robinson [address] [place], [place]  [address]  Dear Karl Robinson,  [facility] [place], and [facility] Robinson announce the retirement of Arta Silence, M.D., from full-time practice at the [facility] office effective January 16, 2010 and his plans of returning part-time.  It is important to Dr. Hetty Ely and to our practice that you understand that [facility] has seven physicians in our office for your health care needs.  We will continue to offer the same exceptional care that you have today.    Dr. Hetty Ely has spoken to many of you about his plans for retirement and returning part-time in the fall.   We will continue to work with you through the transition to schedule appointments for you in the office and meet the high standards that [facility] is committed to.   Again, it is with great pleasure that we share the news that Dr. Hetty Ely will return to [facility] in October of 2011 with a reduced schedule.    If you have any questions, or would like to request an appointment with one of our physicians, please call us at [phone] and press the option for Scheduling an appointment.  We take pleasure in providing you with excellent patient care and look forward to seeing you at your next office visit.  Our [facility] Physicians are:  Tillman Abide, M.D. Laurita Quint, M.D. Roxy Manns, M.D. Kerby Nora, M.D. Hannah Beat, M.D. Ruthe Mannan, M.D. We proudly welcomed Raechel Ache, M.D. and Eustaquio Boyden, M.D. to the practice in July/August 2011.  Sincerely,  [facility]

## 2010-08-21 NOTE — Progress Notes (Signed)
  Phone Note Call from Patient   Caller: Patient Call For: Shaune Leeks MD Summary of Call: Patient daughter is pregnat, has sickle cell traits. Her Dr wants family tested. Can he have test added on to his March labwork Initial call taken by: Mills Koller,  August 09, 2009 11:47 AM  Follow-up for Phone Call        Please feel free to add sickle cell screen to his labs. Follow-up by: Shaune Leeks MD,  August 09, 2009 5:13 PM  Additional Follow-up for Phone Call Additional follow up Details #1::        Ok I added to his labs in March. Additional Follow-up by: Mills Koller,  August 14, 2009 10:30 AM

## 2010-08-21 NOTE — Miscellaneous (Signed)
Summary: Medciation change/Crestor  Clinical Lists Changes  Medications: Removed medication of CRESTOR 10 MG  TABS (ROSUVASTATIN CALCIUM) 1 by mouth daily Added new medication of CRESTOR 20 MG TABS (ROSUVASTATIN CALCIUM) Take one by mouth daily

## 2010-08-21 NOTE — Letter (Signed)
Summary: Results Follow up Letter  [place] at [facility]  [address] [place], [place] [address]   Phone: [phone]  Fax: [phone]    08/19/2009 MRN: [id number]  [facility] [address] [place], [place]  [address]  Dear Mr. Duquette,  The following are the results of your recent test(s):  Test         Result    Pap Smear:        Normal _____  Not Normal _____ Comments: ______________________________________________________ Cholesterol: LDL(Bad cholesterol):         Your goal is less than:         HDL (Good cholesterol):       Your goal is more than: Comments:  ______________________________________________________ Mammogram:        Normal _____  Not Normal _____ Comments:  ___________________________________________________________________ Hemoccult:        Normal __X___  Not normal _______ Comments:  Please repeat in one year.  _____________________________________________________________________ Other Tests:    We routinely do not discuss normal results over the telephone.  If you desire a copy of the results, or you have any questions about this information we can discuss them at your next office visit.   Sincerely,     Laurita Quint, MD

## 2010-08-25 ENCOUNTER — Encounter: Payer: Self-pay | Admitting: Family Medicine

## 2010-08-25 LAB — CONVERTED CEMR LAB
Creatinine, Ser: 1.53 mg/dL
Potassium: 3.9 meq/L
Sodium: 142 meq/L

## 2010-08-27 ENCOUNTER — Encounter: Payer: Self-pay | Admitting: Family Medicine

## 2010-09-04 NOTE — Miscellaneous (Signed)
Summary: Lab results from [facility]  Clinical Lists Changes  Observations: Added new observation of CREATININE: 1.53 mg/dL (40/98/1191 47:82) Added new observation of BG RANDOM: 119 mg/dL (95/62/1308 65:78) Added new observation of K SERUM: 3.9 meq/L (08/25/2010 12:11) Added new observation of NA: 142 meq/L (08/25/2010 12:11)

## 2010-12-05 NOTE — Cardiovascular Report (Signed)
[place]  Patient:    Karl Robinson, Karl Robinson                       MRN: [id number] Proc. Date: 12/14/00 Adm. Date:  [id number] Attending:  Armanda Magic CC:         Vikki Ports, M.D.   Cardiac Catheterization  PROCEDURE:  Left heart catheterization.  INDICATIONS:  Chest pain.  COMPLICATIONS:  None.  INTRODUCTION:  This is a 60 year old black male with a history of hypertension and adrenal hyperplasia who has been having episodic dyspnea on exertion to the point that he cannot walk across the room on occasions.  He has not really had any chest pain, but mainly severe dyspnea on exertion.  He now presents for cardiac catheterization due to a mildly abnormal stress Cardiolite.  DESCRIPTION OF PROCEDURE:  The patient is brought to the cardiac catheterization laboratory in the fasting nonsedated state.  Informed consent was obtained.  The patient was connected to continuous heart rate and pulse oximetry monitoring and intermittent blood pressure monitoring.  The right groin was prepped and draped in the usual sterile fashion.  1% Xylocaine was used for local anesthesia.  Using the modified Seldinger technique, a 6 French sheath was placed in the right femoral artery.  Under fluoroscopic guidance, a 6 [place] JL4 catheter was placed in the left coronary artery.  Multiple cine films were taken in a 30 degree RAO and 40 degree LAO views.  The catheter was then exchanged out over a guide wire for a 6 [place] JR4 catheter which was placed under fluoroscopic guidance into the right coronary artery.  Multiple cinefilms were taken in the RAO 30 degree, LAO 40 degree views.  This catheter was then exchanged out over a guide wire for a 6 French angled pigtail catheter which was placed in the left ventricular cavity under fluoroscopic guidance.  Left ventriculography was performed in the 30 degree RAO view using a total of 24 cc of contrast at 12 cc per  second.  This catheter was then pulled back across the aortic valve with no significant aortic valve gradient noted.  The catheter was then removed over a guide wire.  At the end of the procedure, the catheters and sheaths were removed.  Manual compression was performed until adequate hemostasis was obtained.  The patient was then transferred back to her room in stable condition.  RESULTS:  The left main coronary artery was widely patent throughout its course and bifurcates into a left anterior descending artery and left circumflex artery.  The left anterior descending artery is widely patent throughout its course except for a very mild mid 20% plaque narrowing.  There are two small diagonal branches that are patent.  The left circumflex is widely patent throughout its course and is a dominant vessel with the PDA coming off distally.  There is a first obtuse marginal branch which was a very large branch that bifurcates into two daughter vessels which are widely patent.  The second obtuse marginal branch also branches into two daughter branches and is widely patent.  The distal left circumflex gives off a third obtuse marginal branch which is widely patent and then also a posterior descending artery which is widely patent.  The right coronary artery is nondominant and small and is widely patent throughout the course.  The left ventriculography in the RAO 30 degree view showed normal left ventricular size and systolic function.  Trivial mitral  regurgitation.  Left ventricular pressure is 165/30 mmHg.  Aortic pressure 181/94 mmHg.  ASSESSMENT: 1. Noncardiac chest pain and dyspnea. 2. Essentially normal coronary arteries with 20% mid LAD plaque, left    dominant system. 3. Normal left ventricular function. 4. Hypertension, somewhat elevated today on examination which may be    contributing to some of his dyspnea.  PLAN:  Discharge home later today.  Will check out patient echo to  rule out valvular heart disease and to rule out any significant diastolic dysfunction. DD:  12/14/00 TD:  12/14/00 Job: [id number]

## 2010-12-05 NOTE — Op Note (Signed)
Karl Robinson, Karl Robinson                          ACCOUNT NO.:  [id number]   MEDICAL RECORD NO.:  [id number]                   PATIENT TYPE:  INP   LOCATION:  2899                                 FACILITY:  [facility]   PHYSICIAN:  Kerrin Champagne, M.D.                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   POSTOPERATIVE DIAGNOSIS:  Right C4-5 and right C5-6 foraminal stenosis  secondary to spondylosis changes.   PROCEDURE:  Right C4-5 and right C5-6 Scoville foraminotomy with  decompression of the right C5 and C6 nerve roots.   SURGEON:  Kerrin Champagne, M.D.   ASSISTANT:  Wende Neighbors, P.A.   ANESTHESIA:  GOT, Dr. Diamantina Monks.   ESTIMATED BLOOD LOSS:  75 cc.   DRAINS:  Foley to straight drain.   BRIEF CLINICAL HISTORY:  The patient is a 60 year old male who has been  experiencing pain in his right arm since pulling-type injury occurring  almost a year ago.  He has had persistent pain and discomfort.  He has  undergone extensive evaluation attempts at alleviating his pain.  Studies  have indicated right C5-6 and C4-5 foraminal entrapment.  Followup studies  have indicated a progressive right carpal tunnel syndrome.  The patient's  pain is elicited with Spurling's maneuver and clinically his exam is  consistent with spondylosis with radiation into his right upper extremity.  The patient is brought to the operating room to undergo right sided C4-5, C5-  6 Scoville foraminotomy for spondylosis effecting the right C5 and C6 nerve  roots.   INTRAOPERATIVE FINDINGS:  The patient was found to have significant  foraminal entrapment involving primarily the C5-6 foramen and the C6 nerve  root.  C5 had foraminal narrowing as well, but it was mild.   DESCRIPTION OF PROCEDURE:  After adequate  general anesthesia, the patient in  a prone position, legs were wrapped and a Foley catheter placed, the head in  the [place] horseshoe, well padded, eyes protected.  Shoulders held  backwards with some soft foam rubber anterior to the shoulders.  The patient  prepped with DuraPrep solution from the posterior aspect of the occiput to  the C7-T1 level.  DuraPrep was used and preoperative antibiotics, and  draping.  An Iodine Vi-Drape was used.  Incision approximately an inch and a  half in length to two inches in length extending from the approximately C6  spinous process cranially, through the skin and subcutaneous layers,  infiltration with Marcaine 0.5% with 1:200,000 epinephrine.  The incision  was carried sharply to the spinous process of C6, C5 and later to C4.  The  ligamentum nuchae incised along the right side of the spinous process of C6,  C5.  A towel clip was placed over the tip of the spinous process posteriorly  at C5 and an Allis clamp at the C6 level.  Intraoperative lateral radiograph  demonstrated these to be attached to the aforementioned levels, C5 and C6.  These were marked using a 0 Vicryl stitch for continued identification of  these levels throughout the case.  Under loupe magnification, the ligamentum  nuchae incised off the posterior right side of the spinous process of C5, C6  and C4.  Cobb's used to elevate the cervical muscles off of the posterior  aspect of the lamina of C6, C4 and C5.  The attachment of the paracervical  muscles to the inferior aspect of the lamina of C4 and C5 then incised using  electrocautery.  Bleeders controlled using electrocautery.  A McCullough  retractor inserted, first at the C4-5 level on the right side and under  loupe magnification high speed bur then used to remove the medial aspect of  the C4 inferior articular process on the right, and small portion of the  inferolateral aspect of the lamina of C4 on the right side.  Also, used  to  thin the lamina over the medial aspect of the superior articular process of  C5 on the right and the superolateral aspect of the lamina of C5 on the  right.  Roughly, 50% of the facet was burred.  After some bleeding was  encountered, bone wax was applied to the cancellous bone surfaces.  Additionally, then similar burring was performed and drilling performed over  the medial aspect of the facet at the C5-6 level on the right side.  A  similar drilling of the inferolateral aspect of the lamina of C5 of the  medial aspect of the facet of the inferior articular process of C5, and over  the superior lateral aspect of the lamina of C6 and the medial aspect of the  C6 superior articular process and a high speed bur, 4 mm in diameter.  Operating microscope, drape brought into the field under direct  visualization, and hemostasis obtained to both of these areas of drilling.  Attention turned to the C4-5 level where a partial lateral inferior  laminectomy was performed along with a partial medial facetectomy of the C4-  5 facet on the right, removing portion of the medial aspect of the C4  inferior articular process medially using 1-2 mm Kerrison's.  I then  performed a small partial laminectomy over the superolateral aspect of the  lamina of C5 on the right, and then excising the superior articular process  over its medial 50%, decompressing the right C5 nerve root at its entry into  the neural foramen.  Carefully, the ligament of Flavum had been freed with  partial laminectomy over the superolateral aspect and lamina of C5 was  elevated and epidural veins were cauterized, reaching the interval between  the epidural veins and the thecal sac.  This was carried out laterally,  cauterizing using Bipolar electrocautery as we continued, releasing the  fibrovascular sheath holding the C5 nerve root against the right uncovertebral joint, C4-5.  Once this was decompressed, then the C5 nerve  root was  noted to be exudative without further compression.  A regular nerve  hood could be passed easily out the neural foramen without difficulty.  Gelfoam thrombin soaked placed over the laminotomy area, along with  cottonoids to obtain hemostasis.  Bone wax applied to the cancellous bone  surfaces.  The McCullough retractor then placed  at the C5-6 level where  similarly 1 and 2 mm Kerrison's used to perform a partial inferior semi-  laminectomy over the inferolateral aspect lamina of C5, then partial medial  facetectomy of the inferior articular process of C5, then a partial  superolateral laminectomy at the C6 level with excision of the superior  articular process medially on the right side at C6.  The ligament of Flavum  again then elevated with nerve hook and electrocautery in the form of  bipolar electrocautery was used to cauterize the epidural veins, lifting the  ligament of Flavum and then easily determining the interval between the  epidural veins and the thecal sac and C6 nerve root.  Fibrovascular sheath  and holding the C6 nerve root forward against the uncovertebral junction  carefully divided, freeing the foramen nicely.  A nerve hook could then  easily be passed out the neural foramen without difficulty.  At this time,  it was felt that this was well decompressed.  Thrombin soaked Gelfoam then  used to obtain hemostasis, as well as bone wax applied to the ligament and  cancellous bone surfaces, excess bone wax was removed.  Irrigation performed  over the posterior aspect of the right side, extending from C4 to C6.  Retractors removed.  Careful examination demonstrated no active bleeding  present.  Ligamentum nuchae then reapproximated in the midline with  interrupted 0 Ethibond sutures in figure-of-eight simple fashion.  The deep  subcu layer was approximated with interrupted 0 Vicryl suture and the more  superficial layers with interrupted 2-0 Vicryl suture; skin closed with a   running subcuticular stitch of 4-0 Vicryl.  Tincture of Benzoin and Steri-  Strips applied.  The patient then had 4 x 4s affixed to the skin with  Hypafix tape.  The patient was then reactivated following return to the  supine position, extubated and returned to the recovery room in satisfactory  condition.                                               Kerrin Champagne, M.D.    Myra Rude  D:  10/26/2003  T:  10/29/2003  Job:  [id number]

## 2012-06-09 ENCOUNTER — Encounter: Payer: Self-pay | Admitting: Family Medicine

## 2013-06-19 ENCOUNTER — Ambulatory Visit (INDEPENDENT_AMBULATORY_CARE_PROVIDER_SITE_OTHER): Payer: BC Managed Care – PPO | Admitting: Family Medicine

## 2013-06-19 ENCOUNTER — Encounter (INDEPENDENT_AMBULATORY_CARE_PROVIDER_SITE_OTHER): Payer: Self-pay

## 2013-06-19 ENCOUNTER — Encounter: Payer: Self-pay | Admitting: *Deleted

## 2013-06-19 ENCOUNTER — Ambulatory Visit (INDEPENDENT_AMBULATORY_CARE_PROVIDER_SITE_OTHER)
Admission: RE | Admit: 2013-06-19 | Discharge: 2013-06-19 | Disposition: A | Discharge status: Home / Self Care | Payer: BC Managed Care – PPO | Source: Ambulatory Visit | Attending: Family Medicine | Admitting: Family Medicine

## 2013-06-19 VITALS — BP 128/84 | HR 76 | Temp 99.1°F | Wt 202.5 lb

## 2013-06-19 DIAGNOSIS — M249 Joint derangement, unspecified: Secondary | ICD-10-CM

## 2013-06-19 DIAGNOSIS — E278 Other specified disorders of adrenal gland: Secondary | ICD-10-CM

## 2013-06-19 DIAGNOSIS — M24811 Other specific joint derangements of right shoulder, not elsewhere classified: Secondary | ICD-10-CM

## 2013-06-19 DIAGNOSIS — N259 Disorder resulting from impaired renal tubular function, unspecified: Secondary | ICD-10-CM

## 2013-06-19 DIAGNOSIS — E785 Hyperlipidemia, unspecified: Secondary | ICD-10-CM

## 2013-06-19 DIAGNOSIS — M129 Arthropathy, unspecified: Secondary | ICD-10-CM

## 2013-06-19 DIAGNOSIS — M109 Gout, unspecified: Secondary | ICD-10-CM | POA: Insufficient documentation

## 2013-06-19 DIAGNOSIS — E119 Type 2 diabetes mellitus without complications: Secondary | ICD-10-CM

## 2013-06-19 DIAGNOSIS — I159 Secondary hypertension, unspecified: Secondary | ICD-10-CM

## 2013-06-19 NOTE — Progress Notes (Signed)
Patient seen and examined with PA student Karl Robinson.  Note reviewed, agree with assessment and plan unless changes documented in my note. CC: check spot on shoulder/chest wall  [name] has not been seen here since 2011. He presents today to discuss knot noticed on R shoulder about 1 week ago.  Finds knot anteriolateral chest wall superior to clavicle - tender to palpation.  Knot size changes depending on position.  Dime to quarter size. Denies arm pain, numbness, tingling or weakness.  Denies inciting trauma/injury. Some erythema but no warmth + h/o gout, recently started on allopurinol and colchicine, recently treated with prednisone course.  Followed by [facility].  Medications and allergies reviewed and updated in chart.  Past histories reviewed and updated if relevant as below. Patient Active Problem List   Diagnosis Date Noted  . VITAMIN D DEFICIENCY 07/18/2009  . OSTEOARTHROS UNSPEC WHETHER GEN/LOC UNSPEC SITE 07/11/2009  . HYPOKALEMIA 01/16/2009  . OTH SECONDARY HTN  BENIGN (HYPERALDOST) 07/17/2008  . DIABETES MELLITUS, TYPE II 06/22/2007  . HYPERPLASIA, ADRENAL 06/22/2007  . HYPERLIPIDEMIA 06/22/2007  . ANEMIA-NOS 06/22/2007  . CHANGES IN VASCULAR APPEARANCE OF RETINA 06/22/2007  . MIXED HEARING LOSS UNSPECIFIED 06/22/2007  . GERD 06/22/2007  . DIVERTICULOSIS OF COLON 06/22/2007  . BENIGN PROSTATIC HYPERTROPHY 06/22/2007  . ARTHRITIS 06/22/2007  . DYSPNEA ON EXERTION 06/22/2007  . HYPERALDOSTERONISM 06/21/2007  . RENAL INSUFFICIENCY 06/21/2007   Past Medical History  Diagnosis Date  . Adrenal hyperplasia   . Benign hypertension, endocrine   . HLD (hyperlipidemia)   . Obesity   . CKD (chronic kidney disease) stage 3, GFR 30-59 ml/min     followed by [facility] (Dr. Patton Salles)  . Anemia, unspecified   . Arthropathy, unspecified, site unspecified   . Hypertrophy of prostate without urinary obstruction and other lower urinary tract symptoms (LUTS)   . Changes in vascular  appearance of retina   . Type II or unspecified type diabetes mellitus without mention of complication, not stated as uncontrolled   . Diverticulosis of colon (without mention of hemorrhage)   . Esophageal reflux   . Hyperaldosteronism, unspecified   . Hypopotassemia   . Mixed hearing loss, unspecified   . Unspecified vitamin D deficiency   . Gout   . Cervical stenosis of spine   . Ruptured lumbar disc     L5-S1  . Varicocele     Left   Past Surgical History  Procedure Laterality Date  . Tympanic membrane repair Right 1972    Prosthesis  . Esophagogastroduodenoscopy  11/98    Gastritis; Duod ulcer  . Rotator cuff repair Right 01/17/04  . Neck surgery  10/26/03    Dr. Otelia Sergeant  . Cardiac catheterization  12/14/00    Dr. Leota Jacobsen  . Cardiac catheterization  1989    Dr. Sena Slate  . Cardiovascular stress test  10/21/05    WNL, but had EKG changes  . Bone scan  11/16/07    Normal  . Pelvic mri  11/16/07    Normal  . Lumbosacral mri  11/16/07    L5-S1 Disc Rupture  . Renal arterial US  08/10/03    Negative  . Emg/ncs  05/16/03    RVL Normal, Borderline RCTS  . Cervical mri  04/16/03    Stenosis of C4/5,C5/6 with Narrowing  . Esophagogastroduodenoscopy  05/06/01    Dr. Arty Baumgartner Mucosa Bx-Negative  . US echocardiography  12/07/00    EF 70 %  . Testicular US  09/07/00  Left-sided varicocele (small)  . Flexible sigmoidoscopy  07/18/98    Dr. Woodroe Mode   History  Substance Use Topics  . Smoking status: Never Smoker   . Smokeless tobacco: Never Used  . Alcohol Use: No   Family History  Problem Relation Age of Onset  . Cirrhosis Father     ETOH  . Hypertension Mother     Kidney transplant  . Congestive Heart Failure Mother   . Hypertension Brother   . Drug abuse Brother   . Stroke Maternal Grandmother    Allergies  Allergen Reactions  . Hydromorphone Hcl     REACTION: UNSPECIFIED  . Oxycodone Hcl     REACTION: itch   No current outpatient  prescriptions on file prior to visit.   No current facility-administered medications on file prior to visit.    ROS: Per HPI  PE: GEN: WDWN AAM, NAD CVS: nl S1/S2, no m/r/g Pulm: CTAB, no c/w MSK: hard nodule right AC joint at distal portion of clavicle, anterior displacement of shoulder makes nodule more prominent. neg crossover test, no impingement, FROM R shoulder.  No pain with rotation of humeral head in [facility] joint.

## 2013-06-19 NOTE — Assessment & Plan Note (Signed)
-   Recheck Cr today

## 2013-06-19 NOTE — Patient Instructions (Addendum)
Xray today - looks like some swelling and inflammation of distal clavicle - this could be gout related or infection or other. I will await radiologist eval and call you with plan. Good to see you today, call us with questions. Let's check some blood work today.

## 2013-06-19 NOTE — Progress Notes (Signed)
Pre-visit discussion using our clinic review tool. No additional management support is needed unless otherwise documented below in the visit note.  

## 2013-06-19 NOTE — Progress Notes (Signed)
   Subjective:    Patient ID: Karl Robinson, male    DOB: [birth year], 61 y.o.   MRN: [id number]  HPI CC: knot on right shoulder x 4 days  Presents today with knot at head of right clavicle that he noticed a week ago. Knot is tender to palpation but no pain without palpation  Knot is approximately dime sized.  Size increases with movement and decreases with rest. Knot has occassionally been erythematous with increased size but no warmth. Never had anything like this before. No known injury or fall.   Denies fever, chills, arm pain, numbness/tingling of the right arm.   Rotator cuff surgery about 10 years ago with no issues since. History of gout- on daily allopurinol and colchicine.  Review of Systems Per HPI    Objective:   Physical Exam Right shoulder: Dime sized firm knot at head of clavicle at [facility] joint. Tender to palpation. No erythema or warmth.  Full ROM, 5/5 strength, sensation distal pulses intact Noticeable asymmetry compared to left AC joint.      Assessment & Plan:  X-ray of right AC joint.

## 2013-06-19 NOTE — Assessment & Plan Note (Signed)
Stable, continue dilt, eplereone, HCTZ and lisinopril.

## 2013-06-19 NOTE — Assessment & Plan Note (Signed)
Diet controlled per patient. I've asked pt / wife to bring me copy of latest blood work.

## 2013-06-19 NOTE — Assessment & Plan Note (Signed)
Chronic, stable on current regimen.  

## 2013-06-19 NOTE — Assessment & Plan Note (Signed)
Unclear etiology of this hard swelling - ?gout related vs osteoarthritis, less likely infectious or bone neoplasm. Start with blood work (CBC, UA, ESR) and AC joint xrays today. Consider referral to [facility] for ultrasound.

## 2013-06-20 LAB — CBC WITH DIFFERENTIAL/PLATELET
Basophils Absolute: 0 10*3/uL (ref 0.0–0.1)
Eosinophils Relative: 2.7 % (ref 0.0–5.0)
HCT: 44.2 % (ref 39.0–52.0)
Hemoglobin: 15.1 g/dL (ref 13.0–17.0)
Lymphs Abs: 1.5 10*3/uL (ref 0.7–4.0)
MCHC: 34.1 g/dL (ref 30.0–36.0)
MCV: 81.6 fl (ref 78.0–100.0)
Monocytes Absolute: 0.3 10*3/uL (ref 0.1–1.0)
Monocytes Relative: 6.5 % (ref 3.0–12.0)
Neutro Abs: 3 10*3/uL (ref 1.4–7.7)
Neutrophils Relative %: 60.2 % (ref 43.0–77.0)
Platelets: 230 10*3/uL (ref 150.0–400.0)
RDW: 14 % (ref 11.5–14.6)
WBC: 5 10*3/uL (ref 4.5–10.5)

## 2013-06-20 LAB — BASIC METABOLIC PANEL
BUN: 25 mg/dL — ABNORMAL HIGH (ref 6–23)
CO2: 27 mEq/L (ref 19–32)
Calcium: 9.5 mg/dL (ref 8.4–10.5)
Chloride: 107 mEq/L (ref 96–112)
Potassium: 3.6 mEq/L (ref 3.5–5.1)
Sodium: 141 mEq/L (ref 135–145)

## 2013-06-20 LAB — HIGH SENSITIVITY CRP: CRP, High Sensitivity: 0.93 mg/L (ref 0.000–5.000)

## 2013-06-21 ENCOUNTER — Other Ambulatory Visit: Payer: Self-pay | Admitting: Family Medicine

## 2013-06-21 MED ORDER — DICLOFENAC SODIUM 1 % TD GEL: 1.0000 "application " | Freq: Three times a day (TID) | TRANSDERMAL | Status: DC

## 2013-06-23 ENCOUNTER — Ambulatory Visit: Payer: Self-pay | Admitting: Family Medicine

## 2013-07-06 ENCOUNTER — Encounter: Payer: Self-pay | Admitting: Family Medicine

## 2013-07-06 ENCOUNTER — Ambulatory Visit (INDEPENDENT_AMBULATORY_CARE_PROVIDER_SITE_OTHER): Payer: BC Managed Care – PPO | Admitting: Family Medicine

## 2013-07-06 VITALS — BP 118/74 | HR 56 | Temp 98.1°F | Wt 207.5 lb

## 2013-07-06 DIAGNOSIS — R059 Cough, unspecified: Secondary | ICD-10-CM | POA: Insufficient documentation

## 2013-07-06 DIAGNOSIS — R05 Cough: Secondary | ICD-10-CM

## 2013-07-06 MED ORDER — OMEPRAZOLE 40 MG PO CPDR: DELAYED_RELEASE_CAPSULE | ORAL | Status: DC

## 2013-07-06 NOTE — Progress Notes (Signed)
Pre-visit discussion using our clinic review tool. No additional management support is needed unless otherwise documented below in the visit note.  

## 2013-07-06 NOTE — Progress Notes (Signed)
   Subjective:    Patient ID: Karl Robinson, male    DOB: 09-07-1950, 62 y.o.   MRN: [id number]  HPI CC: cough  3wk h/o cough worse at night time.  Then over last 2 days having associated chest burning that travels up right neck.  This burning feels like his acid reflux for which he takes omeprazole 20mg  in am.  Tried 2nd omeprazole at night time which helped.  Initially some throat congestion which has improved.  Cough productive of clear and thin yellow sputum.  + PNdrainage. Persistent dyspnea thought attributable to GERD (s/p eval at the [place] including PFTs, catheterization). Has f/u scheduled for this.  Denies fevers/chills, sore throat, abd pain, nausea, chest pain/pressure, wheezing, headaches, ear or tooth pain. Denies trouble swallowing, weight loss, vomiting or early satiety.  No sick contacts at home. No new foods. No smokers at home. Did receive flu shot this year.  Does eat mints.  R AC joint swelling has improved with voltaren gel.  Past Medical History  Diagnosis Date  . Adrenal hyperplasia   . Benign hypertension, endocrine   . HLD (hyperlipidemia)   . Obesity   . CKD (chronic kidney disease) stage 3, GFR 30-59 ml/min     followed by [facility] (Dr. Patton Salles)  . Anemia, unspecified   . Arthropathy, unspecified, site unspecified   . Hypertrophy of prostate without urinary obstruction and other lower urinary tract symptoms (LUTS)   . Changes in vascular appearance of retina   . Type II or unspecified type diabetes mellitus without mention of complication, not stated as uncontrolled   . Diverticulosis of colon (without mention of hemorrhage)   . Esophageal reflux   . Hyperaldosteronism, unspecified   . Mixed hearing loss, unspecified   . Unspecified vitamin D deficiency   . Gout   . Cervical stenosis of spine   . Ruptured lumbar disc     L5-S1  . Varicocele     Left    Review of Systems Per HPI    Objective:   Physical Exam  Nursing note and vitals  reviewed. Constitutional: He appears well-developed and well-nourished. No distress.  HENT:  Head: Normocephalic and atraumatic.  Right Ear: Tympanic membrane, external ear and ear canal normal.  Left Ear: Tympanic membrane, external ear and ear canal normal.  Nose: No mucosal edema or rhinorrhea.  Mouth/Throat: Uvula is midline, oropharynx is clear and moist and mucous membranes are normal. No oropharyngeal exudate, posterior oropharyngeal edema, posterior oropharyngeal erythema or tonsillar abscesses.  Eyes: Conjunctivae and EOM are normal. Pupils are equal, round, and reactive to light. No scleral icterus.  Neck: Normal range of motion. Neck supple.  Cardiovascular: Normal rate, regular rhythm, normal heart sounds and intact distal pulses.   No murmur heard. Pulmonary/Chest: Effort normal and breath sounds normal. No respiratory distress. He has no wheezes. He has no rales.  Abdominal: Soft. Bowel sounds are normal. He exhibits no distension and no mass. There is no tenderness. There is no rebound and no guarding.  Lymphadenopathy:    He has no cervical adenopathy.  Skin: Skin is warm and dry. No rash noted.       Assessment & Plan:

## 2013-07-06 NOTE — Assessment & Plan Note (Signed)
Anticipate due to GERD given history and story today. Treat with increase in omeprazole to 20mg  bid from now on.  If this doesn't control sxs, provided with script for omeprazole 40mg  to take bid x 10 days then QD. Pt agrees with plan. No red flags today. To return if sxs persist or worsen.

## 2013-07-06 NOTE — Patient Instructions (Signed)
I think the cough is due to heartburn or reflux. Treat with increasing omeprazole 20mg  to twice daily from now on. If this doesn't control symptoms, increase to omeprazole 40mg  twice daily (and I have printed this prescription out). Let us know if not better with this.

## 2014-08-07 ENCOUNTER — Ambulatory Visit (INDEPENDENT_AMBULATORY_CARE_PROVIDER_SITE_OTHER): Payer: Self-pay | Admitting: Emergency Medicine

## 2014-08-07 VITALS — BP 132/76 | HR 63 | Temp 98.7°F | Resp 18 | Ht 68.5 in | Wt 210.0 lb

## 2014-08-07 DIAGNOSIS — Z021 Encounter for pre-employment examination: Secondary | ICD-10-CM

## 2014-08-07 NOTE — Progress Notes (Signed)
[facility] [address] [place] [address] [phone]  Date:  08/07/2014   Name:  Karl Robinson   DOB:  1950/12/27   MRN:  [id number]  PCP:  Eustaquio Boyden, MD    Chief Complaint: Employment Physical   History of Present Illness:  Karl Robinson is a 64 y.o. very pleasant male patient who presents with the following:  DOT   Patient Active Problem List   Diagnosis Date Noted  . Cough 07/06/2013  . Derangement of right acromioclavicular joint 06/19/2013  . Gout   . VITAMIN D DEFICIENCY 07/18/2009  . OSTEOARTHROS UNSPEC WHETHER GEN/LOC UNSPEC SITE 07/11/2009  . 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 DIABETES MELLITUS, TYPE II 06/22/2007  . HYPERPLASIA, ADRENAL 06/22/2007  . HYPERLIPIDEMIA 06/22/2007  . ANEMIA-NOS 06/22/2007  . CHANGES IN VASCULAR APPEARANCE OF RETINA 06/22/2007  . MIXED HEARING LOSS UNSPECIFIED 06/22/2007  . GERD 06/22/2007  . DIVERTICULOSIS OF COLON 06/22/2007  . BENIGN PROSTATIC HYPERTROPHY 06/22/2007  . ARTHRITIS 06/22/2007  . DYSPNEA ON EXERTION 06/22/2007  . RENAL INSUFFICIENCY 06/21/2007    Past Medical History  Diagnosis Date  . Adrenal hyperplasia   . Benign hypertension, endocrine   . HLD (hyperlipidemia)   . Obesity   . CKD (chronic kidney disease) stage 3, GFR 30-59 ml/min     followed by [facility] (Dr. Patton Salles)  . Anemia, unspecified   . Arthropathy, unspecified, site unspecified   . Hypertrophy of prostate without urinary obstruction and other lower urinary tract symptoms (LUTS)   . Changes in vascular appearance of retina   . Type II or unspecified type diabetes mellitus without mention of complication, not stated as uncontrolled   . Diverticulosis of colon (without mention of hemorrhage)   . Esophageal reflux   . Hyperaldosteronism, unspecified   . Mixed hearing loss, unspecified   . Unspecified vitamin D deficiency   . Gout   . Cervical stenosis of spine   . Ruptured  lumbar disc     L5-S1  . Varicocele     Left    Past Surgical History  Procedure Laterality Date  . Tympanic membrane repair Right 1972    Prosthesis  . Esophagogastroduodenoscopy  11/98    Gastritis; Duod ulcer  . Rotator cuff repair Right 01/17/04  . Neck surgery  10/26/03    Dr. Otelia Sergeant  . Cardiac catheterization  12/14/00    Dr. Leota Jacobsen  . Cardiac catheterization  1989    Dr. Sena Slate  . Cardiovascular stress test  10/21/05    WNL, but had EKG changes  . Bone scan  11/16/07    Normal  . Pelvic mri  11/16/07    Normal  . Lumbosacral mri  11/16/07    L5-S1 Disc Rupture  . Renal arterial US  08/10/03    Negative  . Emg/ncs  05/16/03    RVL Normal, Borderline RCTS  . Cervical mri  04/16/03    Stenosis of C4/5,C5/6 with Narrowing  . Esophagogastroduodenoscopy  05/06/01    Dr. Arty Baumgartner Mucosa Bx-Negative  . US echocardiography  12/07/00    EF 70 %  . Testicular US  09/07/00    Left-sided varicocele (small)  . Flexible sigmoidoscopy  07/18/98    Dr. Woodroe Mode    History  Substance Use Topics  . Smoking status: Never Smoker   . Smokeless tobacco: Never Used  . Alcohol Use: No    Family History  Problem Relation Age of  Onset  . Cirrhosis Father     ETOH  . Hypertension Mother     Kidney transplant  . Congestive Heart Failure Mother   . Hypertension Brother   . Drug abuse Brother   . Stroke Maternal Grandmother     Allergies  Allergen Reactions  . Hydromorphone Hcl     REACTION: UNSPECIFIED  . Oxycodone Hcl     REACTION: itch    Medication list has been reviewed and updated.  Current Outpatient Prescriptions on File Prior to Visit  Medication Sig Dispense Refill  . allopurinol (ZYLOPRIM) 300 MG tablet Take 300 mg by mouth daily.    Marland Kitchen. aspirin 81 MG tablet Take 81 mg by mouth daily.    . colchicine 0.6 MG tablet Take 0.6 mg by mouth.    . diclofenac sodium (VOLTAREN) 1 % GEL Apply 1 application topically 3 (three) times daily. 1 Tube  1  . diltiazem (TIAZAC) 120 MG 24 hr capsule Take 120 mg by mouth 2 (two) times daily.    Marland Kitchen. eplerenone (INSPRA) 25 MG tablet Take 25 mg by mouth daily.    . hydrochlorothiazide (HYDRODIURIL) 25 MG tablet Take 25 mg by mouth daily.    Marland Kitchen. lisinopril (PRINIVIL,ZESTRIL) 20 MG tablet Take 20 mg by mouth 2 (two) times daily.    Marland Kitchen. omeprazole (PRILOSEC) 40 MG capsule Take 40mg  twice daily for 10 days then once daily 60 capsule 0  . rosuvastatin (CRESTOR) 40 MG tablet Take 20 mg by mouth at bedtime.     No current facility-administered medications on file prior to visit.    Review of Systems:  As per HPI, otherwise negative.    Physical Examination: Filed Vitals:   08/07/14 1803  BP: 132/76  Pulse: 63  Temp: 98.7 F (37.1 C)  Resp: 18   Filed Vitals:   08/07/14 1803  Height: 5' 8.5" (1.74 m)  Weight: 210 lb (95.255 kg)   Body mass index is 31.46 kg/(m^2). Ideal Body Weight: Weight in (lb) to have BMI = 25: 166.5  GEN: WDWN, NAD, Non-toxic, A & O x 3 HEENT: Atraumatic, Normocephalic. Neck supple. No masses, No LAD. Ears and Nose: No external deformity. CV: RRR, No M/G/R. No JVD. No thrill. No extra heart sounds. PULM: CTA B, no wheezes, crackles, rhonchi. No retractions. No resp. distress. No accessory muscle use. ABD: S, NT, ND, +BS. No rebound. No HSM. EXTR: No c/c/e NEURO Normal gait.  PSYCH: Normally interactive. Conversant. Not depressed or anxious appearing.  Calm demeanor.    Assessment and Plan: DOT Proteinuria follow up with FMD  Signed,  Phillips OdorJeffery Baudelia Schroepfer, MD

## 2015-01-08 ENCOUNTER — Telehealth: Payer: Self-pay

## 2015-01-08 NOTE — Telephone Encounter (Signed)
I contacted the pt's wife and advised pt's A1C blood test is due. She stated the [facility]. Pt is going to request A1C blood test be sent from the [place] to PCP's office.

## 2016-06-17 ENCOUNTER — Ambulatory Visit (INDEPENDENT_AMBULATORY_CARE_PROVIDER_SITE_OTHER): Payer: Self-pay | Admitting: Physician Assistant

## 2016-06-17 VITALS — BP 136/72 | HR 87 | Temp 97.7°F | Resp 17 | Ht 68.5 in | Wt 205.0 lb

## 2016-06-17 DIAGNOSIS — Z0289 Encounter for other administrative examinations: Secondary | ICD-10-CM

## 2016-06-17 NOTE — Patient Instructions (Signed)
     IF you received an x-ray today, you will receive an invoice from [facility]. Please contact [facility] at [phone] with questions or concerns regarding your invoice.   IF you received labwork today, you will receive an invoice from [facility]. Please contact [facility] at [phone] with questions or concerns regarding your invoice.   Our billing staff will not be able to assist you with questions regarding bills from these companies.  You will be contacted with the lab results as soon as they are available. The fastest way to get your results is to activate your My Chart account. Instructions are located on the last page of this paperwork. If you have not heard from us regarding the results in 2 weeks, please contact this office.      

## 2016-06-23 NOTE — Progress Notes (Signed)
[facility] [address], [place] [address] [phone]  Date:  06/17/2016   Name:  Karl Robinson   DOB:  May 12, 1951   MRN:  [id number]  PCP:  Eustaquio BoydenJavier Gutierrez, MD    History of Present Illness:  Karl Robinson is a 65 y.o. male patient who [facility]. -takes medication as advised.    Patient Active Problem List   Diagnosis Date Noted  . Cough 07/06/2013  . Derangement of right acromioclavicular joint 06/19/2013  . Gout   . VITAMIN D DEFICIENCY 07/18/2009  . OSTEOARTHROS UNSPEC WHETHER GEN/LOC UNSPEC SITE 07/11/2009  . 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 DIABETES MELLITUS, TYPE II 06/22/2007  . HYPERPLASIA, ADRENAL 06/22/2007  . HYPERLIPIDEMIA 06/22/2007  . ANEMIA-NOS 06/22/2007  . CHANGES IN VASCULAR APPEARANCE OF RETINA 06/22/2007  . MIXED HEARING LOSS UNSPECIFIED 06/22/2007  . GERD 06/22/2007  . DIVERTICULOSIS OF COLON 06/22/2007  . BENIGN PROSTATIC HYPERTROPHY 06/22/2007  . ARTHRITIS 06/22/2007  . DYSPNEA ON EXERTION 06/22/2007  . RENAL INSUFFICIENCY 06/21/2007    Past Medical History:  Diagnosis Date  . Adrenal hyperplasia (HCC)   . Anemia, unspecified   . Arthropathy, unspecified, site unspecified   . Benign hypertension, endocrine   . Cervical stenosis of spine   . Changes in vascular appearance of retina   . CKD (chronic kidney disease) stage 3, GFR 30-59 ml/min    [facility]Dr. Patton Sallesocco)  . Diverticulosis of colon (without mention of hemorrhage)   . Esophageal reflux   . Gout   . HLD (hyperlipidemia)   . Hyperaldosteronism, unspecified   . Hypertrophy of prostate without urinary obstruction and other lower urinary tract symptoms (LUTS)   . Mixed hearing loss, unspecified   . Obesity   . Ruptured lumbar disc    L5-S1  . Type II or unspecified type diabetes mellitus without mention of complication, not stated as uncontrolled   . Unspecified  vitamin D deficiency   . Varicocele    Left    Past Surgical History:  Procedure Laterality Date  . Bone Scan  11/16/07   Normal  . CARDIAC CATHETERIZATION  12/14/00   Dr. Leota Jacobsenurner-Normal  . CARDIAC CATHETERIZATION  1989   Dr. Sena SlateGamble-WNL  . CARDIOVASCULAR STRESS TEST  10/21/05   WNL, but had EKG changes  . Cervical MRI  04/16/03   Stenosis of C4/5,C5/6 with Narrowing  . EMG/NCS  05/16/03   RVL Normal, Borderline RCTS  . ESOPHAGOGASTRODUODENOSCOPY  11/98   Gastritis; Duod ulcer  . ESOPHAGOGASTRODUODENOSCOPY  05/06/01   Dr. Arty BaumgartnerSam Wolsey-Granular Mucosa Bx-Negative  . FLEXIBLE SIGMOIDOSCOPY  07/18/98   Dr. Woodroe ModeForeman-Diverticula  . Lumbosacral MRI  11/16/07   L5-S1 Disc Rupture  . NECK SURGERY  10/26/03   Dr. Otelia SergeantNitka  . Pelvic MRI  11/16/07   Normal  . Renal Arterial US  08/10/03   Negative  . ROTATOR CUFF REPAIR Right 01/17/04  . Testicular US  09/07/00   Left-sided varicocele (small)  . TYMPANIC MEMBRANE REPAIR Right 1972   Prosthesis  . US ECHOCARDIOGRAPHY  12/07/00   EF 70 %    Social History  Substance Use Topics  . Smoking status: Never Smoker  . Smokeless tobacco: Never Used  . Alcohol use No    Family History  Problem Relation Age of Onset  . Cirrhosis Father     ETOH  . Hypertension Mother     Kidney  transplant  . Congestive Heart Failure Mother   . Hypertension Brother   . Drug abuse Brother   . Stroke Maternal Grandmother     Allergies  Allergen Reactions  . Hydromorphone Hcl     REACTION: UNSPECIFIED  . Oxycodone Hcl     REACTION: itch    Medication list has been reviewed and updated.  Current Outpatient Prescriptions on File Prior to Visit  Medication Sig Dispense Refill  . allopurinol (ZYLOPRIM) 300 MG tablet Take 300 mg by mouth daily.    Marland Kitchen aspirin 81 MG tablet Take 81 mg by mouth daily.    Marland Kitchen diltiazem (TIAZAC) 120 MG 24 hr capsule Take 120 mg by mouth 2 (two) times daily.    Marland Kitchen eplerenone (INSPRA) 25 MG tablet Take 25 mg by mouth daily.    .  hydrochlorothiazide (HYDRODIURIL) 25 MG tablet Take 25 mg by mouth daily.    Marland Kitchen lisinopril (PRINIVIL,ZESTRIL) 20 MG tablet Take 20 mg by mouth 2 (two) times daily.    Marland Kitchen omeprazole (PRILOSEC) 40 MG capsule Take 40mg  twice daily for 10 days then once daily 60 capsule 0  . rosuvastatin (CRESTOR) 40 MG tablet Take 20 mg by mouth at bedtime.    . diclofenac sodium (VOLTAREN) 1 % GEL Apply 1 application topically 3 (three) times daily. (Patient not taking: Reported on 06/17/2016) 1 Tube 1   No current facility-administered medications on file prior to visit.     Review of Systems  Constitutional: Negative for chills and fever.  HENT: Negative for ear discharge, ear pain and sore throat.   Eyes: Negative for blurred vision and double vision.  Respiratory: Negative for cough, shortness of breath and wheezing.   Cardiovascular: Negative for chest pain, palpitations and leg swelling.  Gastrointestinal: Negative for diarrhea, nausea and vomiting.  Genitourinary: Negative for dysuria, frequency and hematuria.  Skin: Negative for itching and rash.  Neurological: Negative for dizziness and headaches.     Physical Examination: BP 136/72 (BP Location: Right Arm, Patient Position: Sitting, Cuff Size: Normal)   Pulse 87   Temp 97.7 F (36.5 C) (Oral)   Resp 17   Ht 5' 8.5" (1.74 m)   Wt 205 lb (93 kg)   SpO2 97%   BMI 30.72 kg/m  Ideal Body Weight: Weight in (lb) to have BMI = 25: 166.5  Physical Exam  Constitutional: He is oriented to person, place, and time. He appears well-developed and well-nourished. No distress.  HENT:  Head: Normocephalic and atraumatic.  Right Ear: Tympanic membrane, external ear and ear canal normal.  Left Ear: Tympanic membrane, external ear and ear canal normal.  Eyes: Conjunctivae and EOM are normal. Pupils are equal, round, and reactive to light.  Cardiovascular: Normal rate and regular rhythm.  Exam reveals no friction rub.   No murmur heard. Pulmonary/Chest:  Effort normal. No respiratory distress. He has no wheezes.  Abdominal: Soft. Bowel sounds are normal. He exhibits no distension and no mass. There is no tenderness. Hernia confirmed negative in the right inguinal area and confirmed negative in the left inguinal area.  Musculoskeletal: Normal range of motion. He exhibits no edema or tenderness.  Neurological: He is alert and oriented to person, place, and time. He displays normal reflexes.  Skin: Skin is warm and dry. He is not diaphoretic.  Psychiatric: He has a normal mood and affect. His behavior is normal.     Assessment and Plan: Karl Robinson is a 65 y.o. male who is here today  for DOT. 1 year given secondary to HTN, DM2, etc Encounter for examination required by Department of Transportation (DOT)  Trena PlattStephanie English, PA-C [facility] 12/5/20178:22 AM

## 2017-06-02 ENCOUNTER — Encounter: Payer: Self-pay | Admitting: Physician Assistant

## 2017-06-02 ENCOUNTER — Other Ambulatory Visit: Payer: Self-pay

## 2017-06-02 ENCOUNTER — Ambulatory Visit: Payer: Self-pay | Admitting: Physician Assistant

## 2017-06-02 VITALS — BP 114/70 | HR 68 | Temp 98.3°F | Resp 18 | Ht 68.0 in | Wt 193.4 lb

## 2017-06-02 DIAGNOSIS — Z024 Encounter for examination for driving license: Secondary | ICD-10-CM

## 2017-06-02 NOTE — Patient Instructions (Signed)
     IF you received an x-ray today, you will receive an invoice from [facility]. Please contact [facility] at [phone] with questions or concerns regarding your invoice.   IF you received labwork today, you will receive an invoice from [facility]. Please contact [facility] at [phone] with questions or concerns regarding your invoice.   Our billing staff will not be able to assist you with questions regarding bills from these companies.  You will be contacted with the lab results as soon as they are available. The fastest way to get your results is to activate your My Chart account. Instructions are located on the last page of this paperwork. If you have not heard from us regarding the results in 2 weeks, please contact this office.     

## 2017-06-08 NOTE — Progress Notes (Signed)
[facility] [address], [place] [phone]  Date:  06/02/2017   Name:  Loralie Champagnelton L Lancon   DOB:  03-21-1951   MRN:  [id number]  PCP:  Eustaquio BoydenGutierrez, Javier, MD    History of Present Illness:  Loralie Champagnelton L Colasurdo is a 66 y.o. male patient who presents to PCP with  Chief Complaint  Patient presents with  . dot     --he is here today for dot --no concerns or complaints at this time   Patient Active Problem List   Diagnosis Date Noted  . Cough 07/06/2013  . Derangement of right acromioclavicular joint 06/19/2013  . Gout   . VITAMIN D DEFICIENCY 07/18/2009  . OSTEOARTHROS UNSPEC WHETHER GEN/LOC UNSPEC SITE 07/11/2009  . 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 DIABETES MELLITUS, TYPE II 06/22/2007  . HYPERPLASIA, ADRENAL 06/22/2007  . HYPERLIPIDEMIA 06/22/2007  . ANEMIA-NOS 06/22/2007  . CHANGES IN VASCULAR APPEARANCE OF RETINA 06/22/2007  . MIXED HEARING LOSS UNSPECIFIED 06/22/2007  . GERD 06/22/2007  . DIVERTICULOSIS OF COLON 06/22/2007  . BENIGN PROSTATIC HYPERTROPHY 06/22/2007  . ARTHRITIS 06/22/2007  . DYSPNEA ON EXERTION 06/22/2007  . RENAL INSUFFICIENCY 06/21/2007    Past Medical History:  Diagnosis Date  . Adrenal hyperplasia (HCC)   . Anemia, unspecified   . Arthropathy, unspecified, site unspecified   . Benign hypertension, endocrine   . Cervical stenosis of spine   . Changes in vascular appearance of retina   . CKD (chronic kidney disease) stage 3, GFR 30-59 ml/min (HCC)    [facility] (Dr. Patton Sallesocco)  . Diverticulosis of colon (without mention of hemorrhage)   . Esophageal reflux   . Gout   . HLD (hyperlipidemia)   . Hyperaldosteronism, unspecified (HCC)   . Hypertrophy of prostate without urinary obstruction and other lower urinary tract symptoms (LUTS)   . Mixed hearing loss, unspecified   . Obesity   . Ruptured lumbar disc    L5-S1  . Type II or unspecified type diabetes mellitus  without mention of complication, not stated as uncontrolled   . Unspecified vitamin D deficiency   . Varicocele    Left    Past Surgical History:  Procedure Laterality Date  . Bone Scan  11/16/07   Normal  . CARDIAC CATHETERIZATION  12/14/00   Dr. Leota Jacobsenurner-Normal  . CARDIAC CATHETERIZATION  1989   Dr. Sena SlateGamble-WNL  . CARDIOVASCULAR STRESS TEST  10/21/05   WNL, but had EKG changes  . Cervical MRI  04/16/03   Stenosis of C4/5,C5/6 with Narrowing  . EMG/NCS  05/16/03   RVL Normal, Borderline RCTS  . ESOPHAGOGASTRODUODENOSCOPY  11/98   Gastritis; Duod ulcer  . ESOPHAGOGASTRODUODENOSCOPY  05/06/01   Dr. Arty BaumgartnerSam Gary-Granular Mucosa Bx-Negative  . FLEXIBLE SIGMOIDOSCOPY  07/18/98   Dr. Woodroe ModeForeman-Diverticula  . Lumbosacral MRI  11/16/07   L5-S1 Disc Rupture  . NECK SURGERY  10/26/03   Dr. Otelia SergeantNitka  . Pelvic MRI  11/16/07   Normal  . Renal Arterial US  08/10/03   Negative  . ROTATOR CUFF REPAIR Right 01/17/04  . Testicular US  09/07/00   Left-sided varicocele (small)  . TYMPANIC MEMBRANE REPAIR Right 1972   Prosthesis  . US ECHOCARDIOGRAPHY  12/07/00   EF 70 %    Social History   Tobacco Use  . Smoking status: Never Smoker  . Smokeless tobacco: Never Used  Substance Use Topics  . Alcohol use: No  . Drug use: No  Family History  Problem Relation Age of Onset  . Cirrhosis Father        ETOH  . Hypertension Mother        Kidney transplant  . Congestive Heart Failure Mother   . Hypertension Brother   . Drug abuse Brother   . Stroke Maternal Grandmother     Allergies  Allergen Reactions  . Hydromorphone Hcl     REACTION: UNSPECIFIED  . Oxycodone Hcl     REACTION: itch    Medication list has been reviewed and updated.  Current Outpatient Medications on File Prior to Visit  Medication Sig Dispense Refill  . allopurinol (ZYLOPRIM) 300 MG tablet Take 300 mg by mouth daily.    Marland Kitchen. aspirin 81 MG tablet Take 81 mg by mouth daily.    . diclofenac sodium (VOLTAREN) 1 % GEL Apply 1  application topically 3 (three) times daily. 1 Tube 1  . diltiazem (TIAZAC) 120 MG 24 hr capsule Take 120 mg by mouth 2 (two) times daily.    Marland Kitchen. eplerenone (INSPRA) 25 MG tablet Take 25 mg by mouth daily.    . hydrochlorothiazide (HYDRODIURIL) 25 MG tablet Take 25 mg by mouth daily.    Marland Kitchen. lisinopril (PRINIVIL,ZESTRIL) 20 MG tablet Take 20 mg by mouth 2 (two) times daily.    Marland Kitchen. omeprazole (PRILOSEC) 40 MG capsule Take 40mg  twice daily for 10 days then once daily 60 capsule 0  . rosuvastatin (CRESTOR) 40 MG tablet Take 20 mg by mouth at bedtime.     No current facility-administered medications on file prior to visit.     Review of Systems  Constitutional: Negative for chills and fever.  HENT: Negative for ear discharge, ear pain and sore throat.   Eyes: Negative for blurred vision and double vision.  Respiratory: Negative for cough, shortness of breath and wheezing.   Cardiovascular: Negative for chest pain, palpitations and leg swelling.  Gastrointestinal: Negative for diarrhea, nausea and vomiting.  Genitourinary: Negative for dysuria, frequency and hematuria.  Skin: Negative for itching and rash.  Neurological: Negative for dizziness and headaches.   ROS otherwise unremarkable unless listed above.  Physical Examination: BP 114/70   Pulse 68   Temp 98.3 F (36.8 C) (Oral)   Resp 18   Ht 5\' 8"  (1.727 m)   Wt 193 lb 6.4 oz (87.7 kg)   SpO2 98%   BMI 29.41 kg/m  Ideal Body Weight: Weight in (lb) to have BMI = 25: 164.1  Physical Exam  Constitutional: He is oriented to person, place, and time. He appears well-developed and well-nourished. No distress.  HENT:  Head: Atraumatic.  Right Ear: Tympanic membrane, external ear and ear canal normal.  Left Ear: Tympanic membrane, external ear and ear canal normal.  Nose: Mucosal edema and rhinorrhea present. Right sinus exhibits no maxillary sinus tenderness and no frontal sinus tenderness. Left sinus exhibits no maxillary sinus tenderness  and no frontal sinus tenderness.  Mouth/Throat: No uvula swelling. No oropharyngeal exudate, posterior oropharyngeal edema or posterior oropharyngeal erythema.  Eyes: Conjunctivae, EOM and lids are normal. Pupils are equal, round, and reactive to light. Right eye exhibits normal extraocular motion. Left eye exhibits normal extraocular motion.  Neck: Trachea normal and full passive range of motion without pain. No edema and no erythema present.  Cardiovascular: Normal rate.  Pulmonary/Chest: Effort normal. No respiratory distress. He has no decreased breath sounds. He has no wheezes. He has no rhonchi.  Abdominal: Hernia confirmed negative in the right inguinal area  and confirmed negative in the left inguinal area.  Neurological: He is alert and oriented to person, place, and time.  Skin: Skin is warm and dry. He is not diaphoretic.  Psychiatric: He has a normal mood and affect. His behavior is normal.    Visual Acuity Screening   Right eye Left eye Both eyes  Without correction: 20/25 20/20-1 20/20-1  With correction:     Comments: Pt's horizontal field of vision is 85 degrees.  Pt passed color test.   Hearing Screening Comments: Pt passed hearing test from 10 ft   Assessment and Plan: Kealan Buchan Dolin is a 66 y.o. male who is here today for cc of  Chief Complaint  Patient presents with  . dot  1 year certification given  Encounter for commercial driver medical examination (CDME)  Trena Platt, PA-C [facility] 11/20/201810:21 AM

## 2017-10-18 ENCOUNTER — Encounter: Payer: Self-pay | Admitting: Physician Assistant

## 2018-09-03 ENCOUNTER — Emergency Department (HOSPITAL_COMMUNITY)
Admission: EM | Admit: 2018-09-03 | Discharge: 2018-09-03 | Disposition: A | Discharge status: Home / Self Care | Payer: Medicare Other | Attending: Emergency Medicine | Admitting: Emergency Medicine

## 2018-09-03 ENCOUNTER — Emergency Department (HOSPITAL_COMMUNITY): Payer: Medicare Other

## 2018-09-03 DIAGNOSIS — Y939 Activity, unspecified: Secondary | ICD-10-CM | POA: Insufficient documentation

## 2018-09-03 DIAGNOSIS — R51 Headache: Secondary | ICD-10-CM | POA: Diagnosis not present

## 2018-09-03 DIAGNOSIS — Y999 Unspecified external cause status: Secondary | ICD-10-CM | POA: Diagnosis not present

## 2018-09-03 DIAGNOSIS — E119 Type 2 diabetes mellitus without complications: Secondary | ICD-10-CM | POA: Insufficient documentation

## 2018-09-03 DIAGNOSIS — Z79899 Other long term (current) drug therapy: Secondary | ICD-10-CM | POA: Diagnosis not present

## 2018-09-03 DIAGNOSIS — S161XXA Strain of muscle, fascia and tendon at neck level, initial encounter: Secondary | ICD-10-CM

## 2018-09-03 DIAGNOSIS — Y929 Unspecified place or not applicable: Secondary | ICD-10-CM | POA: Insufficient documentation

## 2018-09-03 DIAGNOSIS — X58XXXA Exposure to other specified factors, initial encounter: Secondary | ICD-10-CM | POA: Insufficient documentation

## 2018-09-03 DIAGNOSIS — N183 Chronic kidney disease, stage 3 (moderate): Secondary | ICD-10-CM | POA: Insufficient documentation

## 2018-09-03 DIAGNOSIS — M542 Cervicalgia: Secondary | ICD-10-CM | POA: Diagnosis present

## 2018-09-03 LAB — BASIC METABOLIC PANEL
Anion gap: 9 (ref 5–15)
BUN: 15 mg/dL (ref 8–23)
CALCIUM: 9 mg/dL (ref 8.9–10.3)
CO2: 25 mmol/L (ref 22–32)
Chloride: 105 mmol/L (ref 98–111)
Creatinine, Ser: 2.03 mg/dL — ABNORMAL HIGH (ref 0.61–1.24)
GFR calc Af Amer: 38 mL/min — ABNORMAL LOW (ref >60)
GFR calc non Af Amer: 33 mL/min — ABNORMAL LOW (ref >60)
Glucose, Bld: 118 mg/dL — ABNORMAL HIGH (ref 70–99)
Potassium: 3.8 mmol/L (ref 3.5–5.1)
Sodium: 139 mmol/L (ref 135–145)

## 2018-09-03 LAB — CBC WITH DIFFERENTIAL/PLATELET
Abs Immature Granulocytes: 0.02 10*3/uL (ref 0.00–0.07)
Basophils Absolute: 0 10*3/uL (ref 0.0–0.1)
Basophils Relative: 0 %
EOS ABS: 0.1 10*3/uL (ref 0.0–0.5)
Eosinophils Relative: 2 %
HCT: 38.6 % — ABNORMAL LOW (ref 39.0–52.0)
Hemoglobin: 12.5 g/dL — ABNORMAL LOW (ref 13.0–17.0)
IMMATURE GRANULOCYTES: 0 %
Lymphocytes Relative: 18 %
Lymphs Abs: 1.2 10*3/uL (ref 0.7–4.0)
MCH: 26.9 pg (ref 26.0–34.0)
MCHC: 32.4 g/dL (ref 30.0–36.0)
MCV: 83 fL (ref 80.0–100.0)
Monocytes Absolute: 0.4 10*3/uL (ref 0.1–1.0)
Monocytes Relative: 6 %
Neutro Abs: 4.7 10*3/uL (ref 1.7–7.7)
Neutrophils Relative %: 74 %
Platelets: 168 10*3/uL (ref 150–400)
RBC: 4.65 MIL/uL (ref 4.22–5.81)
RDW: 14.6 % (ref 11.5–15.5)
WBC: 6.5 10*3/uL (ref 4.0–10.5)
nRBC: 0 % (ref 0.0–0.2)

## 2018-09-03 MED ORDER — SODIUM CHLORIDE 0.9 % IV BOLUS
500.0000 mL | Freq: Once | INTRAVENOUS | Status: AC
  Administered 2018-09-03: 500 mL via INTRAVENOUS

## 2018-09-03 MED ORDER — DIAZEPAM 5 MG/ML IJ SOLN
2.5000 mg | Freq: Once | INTRAMUSCULAR | Status: AC
  Administered 2018-09-03: 2.5 mg via INTRAVENOUS
  Filled 2018-09-03: qty 2

## 2018-09-03 MED ORDER — LIDOCAINE HCL (PF) 1 % IJ SOLN
INTRAMUSCULAR | Status: AC
  Filled 2018-09-03: qty 5

## 2018-09-03 MED ORDER — METHYLPREDNISOLONE 4 MG PO TBPK: ORAL_TABLET | ORAL | 0 refills | Status: DC

## 2018-09-03 MED ORDER — ACETAMINOPHEN 500 MG PO TABS
1000.0000 mg | ORAL_TABLET | Freq: Once | ORAL | Status: AC
  Administered 2018-09-03: 1000 mg via ORAL
  Filled 2018-09-03: qty 2

## 2018-09-03 MED ORDER — FENTANYL CITRATE (PF) 100 MCG/2ML IJ SOLN
50.0000 ug | Freq: Once | INTRAMUSCULAR | Status: AC
  Administered 2018-09-03: 50 ug via INTRAVENOUS
  Filled 2018-09-03: qty 2

## 2018-09-03 MED ORDER — METHOCARBAMOL 500 MG PO TABS: 500.0000 mg | ORAL_TABLET | Freq: Two times a day (BID) | ORAL | 0 refills | Status: DC

## 2018-09-03 MED ORDER — OXYCODONE HCL 5 MG PO TABS
5.0000 mg | ORAL_TABLET | Freq: Once | ORAL | Status: AC
  Administered 2018-09-03: 5 mg via ORAL
  Filled 2018-09-03: qty 1

## 2018-09-03 MED ORDER — LIDOCAINE 5 % EX PTCH: 1.0000 | MEDICATED_PATCH | CUTANEOUS | 0 refills | Status: AC

## 2018-09-03 MED ORDER — IOPAMIDOL (ISOVUE-300) INJECTION 61%
INTRAVENOUS | Status: AC
  Administered 2018-09-03: 50 mL
  Filled 2018-09-03: qty 50

## 2018-09-03 MED ORDER — LIDOCAINE HCL (PF) 1 % IJ SOLN
5.0000 mL | Freq: Once | INTRAMUSCULAR | Status: DC
  Filled 2018-09-03: qty 5

## 2018-09-03 MED ORDER — DIAZEPAM 5 MG PO TABS
5.0000 mg | ORAL_TABLET | Freq: Once | ORAL | Status: AC
  Administered 2018-09-03: 5 mg via ORAL
  Filled 2018-09-03: qty 1

## 2018-09-03 MED ORDER — OXYCODONE HCL 5 MG PO TABS: 5.0000 mg | ORAL_TABLET | Freq: Three times a day (TID) | ORAL | 0 refills | Status: DC | PRN

## 2018-09-03 NOTE — ED Provider Notes (Signed)
.  Nerve Block Date/Time: 09/03/2018 2:06 PM Performed by: Arthor Captain, PA-C Authorized by: Arthor Captain, PA-C   Consent:    Consent obtained:  Verbal   Consent given by:  Patient   Risks discussed:  Allergic reaction, infection, swelling, unsuccessful block and intravenous injection   Alternatives discussed:  No treatment and delayed treatment Indications:    Indications:  Pain relief Location:    Body area:  Head   Head nerve:  Occipital   Laterality:  Bilateral Pre-procedure details:    Skin preparation:  2% chlorhexidine Skin anesthesia (see MAR for exact dosages):    Skin anesthesia method:  Local infiltration   Local anesthetic:  Lidocaine 1% w/o epi Procedure details (see MAR for exact dosages):    Block needle gauge:  25 G   Steroid injected:  None   Additive injected:  None   Injection procedure:  Incremental injection, negative aspiration for blood and anatomic landmarks palpated   Paresthesia:  None Post-procedure details:    Outcome:  Anesthesia achieved   Patient tolerance of procedure:  Tolerated well, no immediate complications      Arthor Captain, PA-C 09/03/18 1407    Virgina Norfolk, DO 09/03/18 1645

## 2018-09-03 NOTE — ED Triage Notes (Signed)
C/o pain to posterior neck, across shoulder blades, and back of head x 1 week.  PT wearing soft collar at this time.  Denies known injury.  Pain is sharp and shooting- worse with movement.

## 2018-09-03 NOTE — ED Notes (Signed)
Pt transported to MRI 

## 2018-09-03 NOTE — ED Provider Notes (Signed)
[facility]GENCY [facility] Provider Note   CSN: [id number] Arrival date & time: 09/03/18  [id number]     History   Chief Complaint Chief Complaint  Patient presents with  . Neck Pain    HPI Karl Robinson is a 68 y.o. male.  The history is provided by the patient.  Headache  Pain location:  Occipital (neck) Quality:  Dull Radiates to:  L neck and R neck Severity currently:  9/10 Severity at highest:  8/10 Onset quality:  Gradual Timing:  Intermittent Progression:  Waxing and waning Chronicity:  New Context comment:  Unknown, has had issues for weeks, worse overnight, has had work [facility] at [facility] and unknown if anything found out. Hx of neck fushion in the past.  Relieved by:  Nothing Worsened by:  Neck movement Associated symptoms: neck pain and neck stiffness   Associated symptoms: no abdominal pain, no back pain, no blurred vision, no cough, no diarrhea, no ear pain, no eye pain, no facial pain, no fever, no hearing loss, no loss of balance, no near-syncope, no numbness, no paresthesias, no photophobia, no seizures, no sore throat, no visual change and no vomiting     Past Medical History:  Diagnosis Date  . Adrenal hyperplasia (HCC)   . Anemia, unspecified   . Arthropathy, unspecified, site unspecified   . Benign hypertension, endocrine   . Cervical stenosis of spine   . Changes in vascular appearance of retina   . CKD (chronic kidney disease) stage 3, GFR 30-59 ml/min (HCC)    [facility]Dr. Patton Sallesocco)  . Diverticulosis of colon (without mention of hemorrhage)   . Esophageal reflux   . Gout   . HLD (hyperlipidemia)   . Hyperaldosteronism, unspecified (HCC)   . Hypertrophy of prostate without urinary obstruction and other lower urinary tract symptoms (LUTS)   . Mixed hearing loss, unspecified   . Obesity   . Ruptured lumbar disc    L5-S1  . Type II or unspecified type diabetes mellitus without mention of complication, not stated as  uncontrolled   . Unspecified vitamin D deficiency   . Varicocele    Left    Patient Active Problem List   Diagnosis Date Noted  . Cough 07/06/2013  . Derangement of right acromioclavicular joint 06/19/2013  . Gout   . VITAMIN D DEFICIENCY 07/18/2009  . OSTEOARTHROS UNSPEC WHETHER GEN/LOC UNSPEC SITE 07/11/2009  . 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 DIABETES MELLITUS, TYPE II 06/22/2007  . HYPERPLASIA, ADRENAL 06/22/2007  . HYPERLIPIDEMIA 06/22/2007  . ANEMIA-NOS 06/22/2007  . CHANGES IN VASCULAR APPEARANCE OF RETINA 06/22/2007  . MIXED HEARING LOSS UNSPECIFIED 06/22/2007  . GERD 06/22/2007  . DIVERTICULOSIS OF COLON 06/22/2007  . BENIGN PROSTATIC HYPERTROPHY 06/22/2007  . ARTHRITIS 06/22/2007  . DYSPNEA ON EXERTION 06/22/2007  . RENAL INSUFFICIENCY 06/21/2007    Past Surgical History:  Procedure Laterality Date  . Bone Scan  11/16/07   Normal  . CARDIAC CATHETERIZATION  12/14/00   Dr. Leota Jacobsenurner-Normal  . CARDIAC CATHETERIZATION  1989   Dr. Sena SlateGamble-WNL  . CARDIOVASCULAR STRESS TEST  10/21/05   WNL, but had EKG changes  . Cervical MRI  04/16/03   Stenosis of C4/5,C5/6 with Narrowing  . EMG/NCS  05/16/03   RVL Normal, Borderline RCTS  . ESOPHAGOGASTRODUODENOSCOPY  11/98   Gastritis; Duod ulcer  . ESOPHAGOGASTRODUODENOSCOPY  05/06/01   Dr. Arty BaumgartnerSam -Granular Mucosa Bx-Negative  . FLEXIBLE SIGMOIDOSCOPY  07/18/98   Dr.  Foreman-Diverticula  . Lumbosacral MRI  11/16/07   L5-S1 Disc Rupture  . NECK SURGERY  10/26/03   Dr. Otelia Sergeant  . Pelvic MRI  11/16/07   Normal  . Renal Arterial US  08/10/03   Negative  . ROTATOR CUFF REPAIR Right 01/17/04  . Testicular US  09/07/00   Left-sided varicocele (small)  . TYMPANIC MEMBRANE REPAIR Right 1972   Prosthesis  . US ECHOCARDIOGRAPHY  12/07/00   EF 70 %        Home Medications    Prior to Admission medications   Medication Sig Start Date End Date Taking? Authorizing Provider  allopurinol  (ZYLOPRIM) 100 MG tablet Take 100 mg by mouth daily. Pt takes with 300 mg to equal total dose of 400 mg   Yes [provider]  allopurinol (ZYLOPRIM) 300 MG tablet Take 300 mg by mouth daily. 100 mg with 300 mg to equal 400 mg total dose   Yes [provider]  amLODipine (NORVASC) 10 MG tablet Take 10 mg by mouth daily.   Yes [provider]  aspirin 81 MG tablet Take 81 mg by mouth daily.   Yes [provider]  Coenzyme Q10 (COQ-10) 10 MG CAPS Take 10 mg by mouth daily.   Yes [provider]  diclofenac sodium (VOLTAREN) 1 % GEL Apply 1 application topically 3 (three) times daily. 06/21/13  Yes Eustaquio Boyden, MD  diltiazem [facility]) 120 MG 24 hr capsule Take 120 mg by mouth 2 (two) times daily.   Yes [provider]  eplerenone (INSPRA) 25 MG tablet Take 25 mg by mouth 2 (two) times daily.    Yes [provider]  escitalopram (LEXAPRO) 10 MG tablet Take 10 mg by mouth daily.   Yes [provider]  finasteride (PROSCAR) 5 MG tablet Take 5 mg by mouth daily.   Yes [provider]  fluocinonide cream (LIDEX) 0.05 % Apply 1 application topically daily as needed. Rash   Yes [provider]  fluticasone (FLONASE) 50 MCG/ACT nasal spray Place 2 sprays into both nostrils daily.   Yes [provider]  lisinopril (PRINIVIL,ZESTRIL) 20 MG tablet Take 20 mg by mouth 2 (two) times daily.   Yes [provider]  Multiple Vitamin (MULTIVITAMIN) tablet Take 1 tablet by mouth daily.   Yes [provider]  pantoprazole (PROTONIX) 40 MG tablet Take 40 mg by mouth daily.   Yes [provider]  prazosin (MINIPRESS) 2 MG capsule Take 4 mg by mouth at bedtime.   Yes [provider]  rosuvastatin (CRESTOR) 40 MG tablet Take 20 mg by mouth at bedtime.   Yes [provider]  hydrochlorothiazide (HYDRODIURIL) 25 MG tablet Take 25 mg by mouth daily.    [provider]    lidocaine (LIDODERM) 5 % Place 1 patch onto the skin daily for 30 days. Remove & Discard patch within 12 hours or as directed by MD 09/03/18 10/03/18  Virgina Norfolk, DO  methocarbamol (ROBAXIN) 500 MG tablet Take 1 tablet (500 mg total) by mouth 2 (two) times daily. 09/03/18   Arieh Bogue, DO  methylPREDNISolone (MEDROL DOSEPAK) 4 MG TBPK tablet As written in package 09/03/18   Virgina Norfolk, DO  omeprazole (PRILOSEC) 40 MG capsule Take 40mg  twice daily for 10 days then once daily Patient not taking: Reported on 09/03/2018 07/06/13   Eustaquio Boyden, MD  oxyCODONE (ROXICODONE) 5 MG immediate release tablet Take 1 tablet (5 mg total) by mouth every 8 (eight) hours  as needed for up to 12 doses for severe pain. 09/03/18   Virgina Norfolk, DO    Family History Family History  Problem Relation Age of Onset  . Cirrhosis Father        ETOH  . Hypertension Mother        Kidney transplant  . Congestive Heart Failure Mother   . Hypertension Brother   . Drug abuse Brother   . Stroke Maternal Grandmother     Social History Social History   Tobacco Use  . Smoking status: Never Smoker  . Smokeless tobacco: Never Used  Substance Use Topics  . Alcohol use: No  . Drug use: No     Allergies   Hydromorphone hcl and Oxycodone hcl   Review of Systems Review of Systems  Constitutional: Negative for chills and fever.  HENT: Negative for ear pain, hearing loss and sore throat.   Eyes: Negative for blurred vision, photophobia, pain and visual disturbance.  Respiratory: Negative for cough and shortness of breath.   Cardiovascular: Negative for chest pain, palpitations and near-syncope.  Gastrointestinal: Negative for abdominal pain, diarrhea and vomiting.  Genitourinary: Negative for dysuria and hematuria.  Musculoskeletal: Positive for neck pain and neck stiffness. Negative for arthralgias and back pain.  Skin: Negative for color change and rash.  Neurological: Positive for headaches.  Negative for seizures, syncope, numbness, paresthesias and loss of balance.  All other systems reviewed and are negative.    Physical Exam Updated Vital Signs  ED Triage Vitals  Enc Vitals Group     BP 09/03/18 0623 (!) 146/75     Pulse Rate 09/03/18 0623 100     Resp 09/03/18 0623 17     Temp 09/03/18 0623 97.9 F (36.6 C)     Temp Source 09/03/18 0623 Oral     SpO2 09/03/18 0623 98 %     Weight 09/03/18 0623 197 lb (89.4 kg)     Height 09/03/18 0623 5\' 9"  (1.753 m)     Head Circumference --      Peak Flow --      Pain Score 09/03/18 0649 10     Pain Loc --      Pain Edu? --      Excl. in GC? --     Physical Exam Vitals signs and nursing note reviewed.  Constitutional:      General: He is in acute distress.     Appearance: He is well-developed.  HENT:     Head: Normocephalic and atraumatic.     Mouth/Throat:     Mouth: Mucous membranes are moist.  Eyes:     Extraocular Movements: Extraocular movements intact.     Conjunctiva/sclera: Conjunctivae normal.     Pupils: Pupils are equal, round, and reactive to light.  Neck:     Musculoskeletal: Neck supple. Muscular tenderness present.     Comments: Decreased ROM of cervical spine secondary to pain  Cardiovascular:     Rate and Rhythm: Normal rate and regular rhythm.     Pulses: Normal pulses.     Heart sounds: Normal heart sounds. No murmur.  Pulmonary:     Effort: Pulmonary effort is normal. No respiratory distress.     Breath sounds: Normal breath sounds.  Abdominal:     General: There is no distension.     Palpations: Abdomen is soft.     Tenderness: There is no abdominal tenderness.  Musculoskeletal:        General: Tenderness (TTP to paraspinal  cervical muscles ) present. No signs of injury.     Right lower leg: No edema.     Left lower leg: No edema.  Skin:    General: Skin is warm and dry.  Neurological:     General: No focal deficit present.     Mental Status: He is alert and oriented to person,  place, and time.     Cranial Nerves: No cranial nerve deficit.     Sensory: No sensory deficit.     Motor: No weakness.     Coordination: Coordination normal.     Comments: 5+/5 strength throughout, normal sensation, no drift, normal finger to nose finger      ED Treatments / Results  Labs (all labs ordered are listed, but only abnormal results are displayed) Labs Reviewed  CBC WITH DIFFERENTIAL/PLATELET - Abnormal; Notable for the following components:      Result Value   Hemoglobin 12.5 (*)    HCT 38.6 (*)    All other components within normal limits  BASIC METABOLIC PANEL - Abnormal; Notable for the following components:   Glucose, Bld 118 (*)    Creatinine, Ser 2.03 (*)    GFR calc non Af Amer 33 (*)    GFR calc Af Amer 38 (*)    All other components within normal limits    EKG None  Radiology Ct Angio Head W Or Wo Contrast  Result Date: 09/03/2018 CLINICAL DATA:  Posterior neck and head pain EXAM: CT ANGIOGRAPHY HEAD AND NECK TECHNIQUE: Multidetector CT imaging of the head and neck was performed using the standard protocol during bolus administration of intravenous contrast. Multiplanar CT image reconstructions and MIPs were obtained to evaluate the vascular anatomy. Carotid stenosis measurements (when applicable) are obtained utilizing NASCET criteria, using the distal internal carotid diameter as the denominator. CONTRAST:  48mL ISOVUE-300 IOPAMIDOL (ISOVUE-300) INJECTION 61% COMPARISON:  Cervical MRI same day. FINDINGS: CT HEAD FINDINGS Brain: No sign of acute infarction, mass lesion, hemorrhage, hydrocephalus or extra-axial collection. Chronic small-vessel ischemic changes of the cerebral hemispheric white matter. Vascular: There is atherosclerotic calcification of the major vessels at the base of the brain. Skull: Negative Sinuses: Clear/normal Orbits: Normal Review of the MIP images confirms the above findings CTA NECK FINDINGS Aortic arch: Aortic atherosclerosis. No  aneurysm or dissection. Common origin of the innominate artery and left common carotid artery. Right carotid system: Carotid artery widely patent to the bifurcation. Carotid bifurcation widely patent without soft or calcified plaque. Cervical ICA is normal. Left carotid system: Common carotid artery widely patent to the bifurcation. Carotid bifurcation is normal without soft or calcified plaque. Cervical ICA is normal. Vertebral arteries: Both vertebral artery origins are widely patent. Both vertebral arteries appear normal through the cervical region to the foramen magnum. Skeleton: Ordinary mid cervical spondylosis.  No acute finding. Other neck: No mass or lymphadenopathy. Upper chest: Negative Review of the MIP images confirms the above findings CTA HEAD FINDINGS Anterior circulation: Both internal carotid arteries are patent through the skull base and siphon regions. There is siphon atherosclerosis but no stenosis greater than 30% suspected. The anterior and middle cerebral vessels are patent without proximal stenosis, aneurysm or vascular malformation. Posterior circulation: Both vertebral arteries patent through the foramen magnum to the basilar. No basilar stenosis. Posterior circulation branch vessels are normal. Venous sinuses: Patent and normal. Anatomic variants: None significant. Delayed phase: No abnormal enhancement. Review of the MIP images confirms the above findings IMPRESSION: Negative examination. No evidence of significant atherosclerotic  disease. Both carotid bifurcations are normal. No evidence of vascular dissection. No intracranial stenosis or occlusion. No acute brain finding. Chronic appearing small-vessel ischemic changes of the cerebral hemispheric white matter. Electronically Signed   By: Paulina FusiMark  Shogry M.D.   On: 09/03/2018 12:22   Ct Angio Neck W And/or Wo Contrast  Result Date: 09/03/2018 CLINICAL DATA:  Posterior neck and head pain EXAM: CT ANGIOGRAPHY HEAD AND NECK TECHNIQUE:  Multidetector CT imaging of the head and neck was performed using the standard protocol during bolus administration of intravenous contrast. Multiplanar CT image reconstructions and MIPs were obtained to evaluate the vascular anatomy. Carotid stenosis measurements (when applicable) are obtained utilizing NASCET criteria, using the distal internal carotid diameter as the denominator. CONTRAST:  50mL ISOVUE-300 IOPAMIDOL (ISOVUE-300) INJECTION 61% COMPARISON:  Cervical MRI same day. FINDINGS: CT HEAD FINDINGS Brain: No sign of acute infarction, mass lesion, hemorrhage, hydrocephalus or extra-axial collection. Chronic small-vessel ischemic changes of the cerebral hemispheric white matter. Vascular: There is atherosclerotic calcification of the major vessels at the base of the brain. Skull: Negative Sinuses: Clear/normal Orbits: Normal Review of the MIP images confirms the above findings CTA NECK FINDINGS Aortic arch: Aortic atherosclerosis. No aneurysm or dissection. Common origin of the innominate artery and left common carotid artery. Right carotid system: Carotid artery widely patent to the bifurcation. Carotid bifurcation widely patent without soft or calcified plaque. Cervical ICA is normal. Left carotid system: Common carotid artery widely patent to the bifurcation. Carotid bifurcation is normal without soft or calcified plaque. Cervical ICA is normal. Vertebral arteries: Both vertebral artery origins are widely patent. Both vertebral arteries appear normal through the cervical region to the foramen magnum. Skeleton: Ordinary mid cervical spondylosis.  No acute finding. Other neck: No mass or lymphadenopathy. Upper chest: Negative Review of the MIP images confirms the above findings CTA HEAD FINDINGS Anterior circulation: Both internal carotid arteries are patent through the skull base and siphon regions. There is siphon atherosclerosis but no stenosis greater than 30% suspected. The anterior and middle cerebral  vessels are patent without proximal stenosis, aneurysm or vascular malformation. Posterior circulation: Both vertebral arteries patent through the foramen magnum to the basilar. No basilar stenosis. Posterior circulation branch vessels are normal. Venous sinuses: Patent and normal. Anatomic variants: None significant. Delayed phase: No abnormal enhancement. Review of the MIP images confirms the above findings IMPRESSION: Negative examination. No evidence of significant atherosclerotic disease. Both carotid bifurcations are normal. No evidence of vascular dissection. No intracranial stenosis or occlusion. No acute brain finding. Chronic appearing small-vessel ischemic changes of the cerebral hemispheric white matter. Electronically Signed   By: Paulina FusiMark  Shogry M.D.   On: 09/03/2018 12:22   Mr Cervical Spine Wo Contrast  Result Date: 09/03/2018 CLINICAL DATA:  Radiculopathy and neck spasms. EXAM: MRI CERVICAL SPINE WITHOUT CONTRAST TECHNIQUE: Multiplanar, multisequence MR imaging of the cervical spine was performed. No intravenous contrast was administered. COMPARISON:  None. FINDINGS: Alignment: Straightening of the normal cervical lordosis. Vertebrae: No fracture or primary bone lesion. Cord: No cord compression or primary cord lesion. Posterior Fossa, vertebral arteries, paraspinal tissues: Negative Disc levels: No abnormality at the foramen magnum, C1-2 or C2-3. C3-4: Central disc bulge narrows the ventral subarachnoid space but does not compress the cord. Mild facet osteoarthritis on the left without edema. No significant foraminal stenosis. C4-5: Spondylosis with endplate osteophytes and bulging of the disc. Narrowing of the ventral subarachnoid space but no compression of the cord. Mild bilateral bony foraminal narrowing. C5-6: Spondylosis with endplate  osteophytes and bulging of the disc. Narrowing of the ventral subarachnoid space but no compression of the cord. Mild bilateral bony foraminal narrowing. C6-7:  Spondylosis with endplate osteophytes and bulging of the disc. Narrowing of the subarachnoid space but no compression of the cord. Mild bilateral foraminal narrowing. C7-T1: Mild disc bulge. Facet osteoarthritis on the left. No central canal stenosis. Foraminal narrowing on the left with some potential to affect the C8 nerve. IMPRESSION: No acute finding. Chronic appearing cervical spondylosis from C3-4 through C6-7. Narrowing of the ventral subarachnoid space but no compression of the cord. Mild bilateral foraminal narrowing without definite neural compression. Left-sided foraminal stenosis at C6-7 primarily because of encroachment by facet osteophytes. This would have some potential to affect the left C8 nerve. Electronically Signed   By: Paulina Fusi M.D.   On: 09/03/2018 11:26    Procedures Procedures (including critical care time)  Medications Ordered in ED Medications  diazepam (VALIUM) injection 2.5 mg (2.5 mg Intravenous Given 09/03/18 0745)  fentaNYL (SUBLIMAZE) injection 50 mcg (50 mcg Intravenous Given 09/03/18 0855)  sodium chloride 0.9 % bolus 500 mL (0 mLs Intravenous Stopped 09/03/18 1307)  fentaNYL (SUBLIMAZE) injection 50 mcg (50 mcg Intravenous Given 09/03/18 1131)  iopamidol (ISOVUE-300) 61 % injection (50 mLs  Contrast Given 09/03/18 1144)  diazepam (VALIUM) tablet 5 mg (5 mg Oral Given 09/03/18 1318)  oxyCODONE (Oxy IR/ROXICODONE) immediate release tablet 5 mg (5 mg Oral Given 09/03/18 1318)  acetaminophen (TYLENOL) tablet 1,000 mg (1,000 mg Oral Given 09/03/18 1318)     Initial Impression / Assessment and Plan / ED Course  I have reviewed the triage vital signs and the nursing notes.  Pertinent labs & imaging results that were available during my care of the patient were reviewed by me and considered in my medical decision making (see chart for details).     Karl Robinson is a 68 year old male with history of cervical spine stenosis, CKD, hypertension who presents to the ED  with neck pain.  Patient with symptoms over the last several days to weeks.  Increasingly worse this morning.  Patient with normal vitals.  No fever.  Normal neurological exam.  Normal pulses throughout.  Denies any trauma history.  Patient denies any dizziness.  CTA of the head and neck was performed that was overall unremarkable.  MRI of the cervical spine showed overall chronic processes.  No obvious cord compression. No vascular process. Patient does have some chronic narrowing and could have some mild nerve impingement.  However, patient has normal strength and sensation in the upper extremities.  Overall given imaging and exam patient likely with bad muscle strain/spasm.  Has multiple trigger point on exam.  My physician assistant successfully perform trigger point injections and patient had great improvement.  Patient was given Valium, oxycodone, Tylenol also with improvement.  Patient given prescription for Roxicodone, lidocaine patches, Robaxin.  Recommend close follow-up with primary care doctor.  Likely would benefit from physical therapy.  Recommend heating pad as well.  Discharged from ED in good condition given return precautions.  This chart was dictated using voice recognition software.  Despite best efforts to proofread,  errors can occur which can change the documentation meaning.    Final Clinical Impressions(s) / ED Diagnoses   Final diagnoses:  Acute strain of neck muscle, initial encounter    ED Discharge Orders         Ordered    lidocaine (LIDODERM) 5 %  Every 24 hours  09/03/18 1333    oxyCODONE (ROXICODONE) 5 MG immediate release tablet  Every 8 hours PRN     09/03/18 1333    methocarbamol (ROBAXIN) 500 MG tablet  2 times daily     09/03/18 1333    methylPREDNISolone (MEDROL DOSEPAK) 4 MG TBPK tablet     09/03/18 1334           Jaidence Geisler, DO 09/03/18 1731

## 2020-08-29 IMAGING — CT CT ANGIO HEAD
2 of 11 series · 8 of 47 positions shown · IV contrast (OMNI)
Comparison: Cervical MRI same day.

CLINICAL DATA: Posterior neck and head pain

EXAM:
CT ANGIOGRAPHY HEAD AND NECK
TECHNIQUE: Multidetector CT imaging of the head and neck was performed using
the standard protocol during bolus administration of intravenous
contrast. Multiplanar CT image reconstructions and MIPs were
obtained to evaluate the vascular anatomy. Carotid stenosis
measurements (when applicable) are obtained utilizing NASCET
criteria, using the distal internal carotid diameter as the
denominator.
CONTRAST:  50mL XEGVY3-IEE IOPAMIDOL (XEGVY3-IEE) INJECTION 61%

[Series 8: carotid/brain 2.0 i30f 3 · axial · 0.59mm/px · z∈[-203,+17]mm · 5 of 166 slices shown]
[im 28/166  brain]
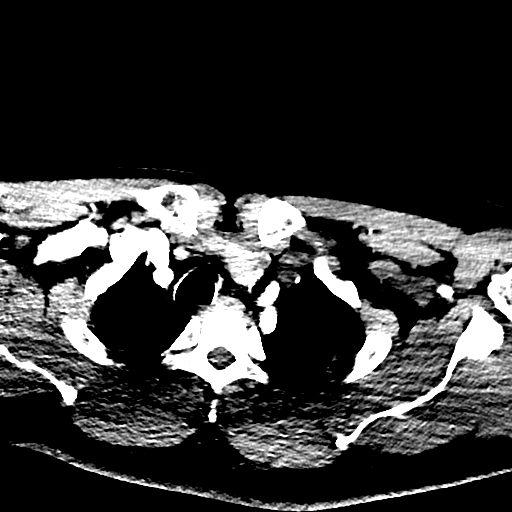
[im 56/166  brain]
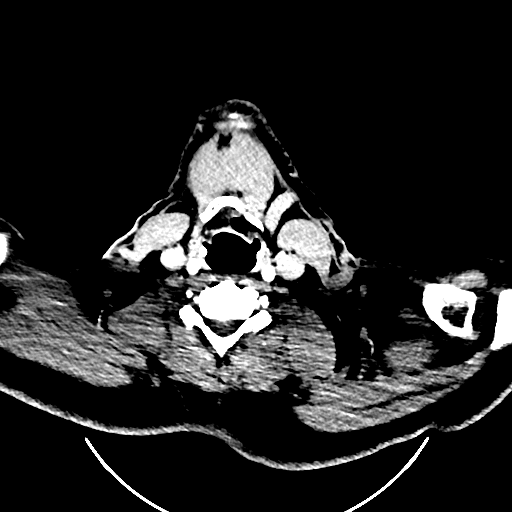
[im 83/166  brain]
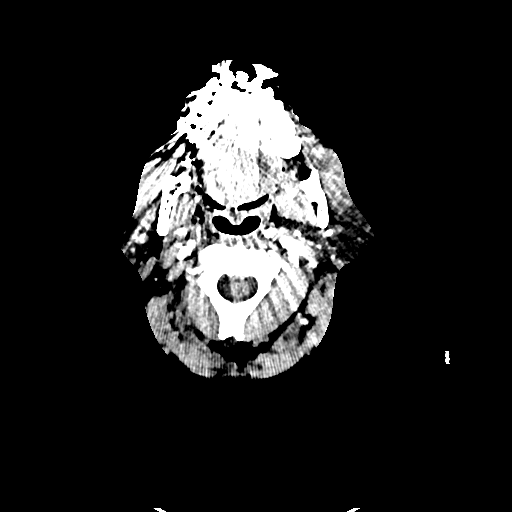
[im 111/166  brain]
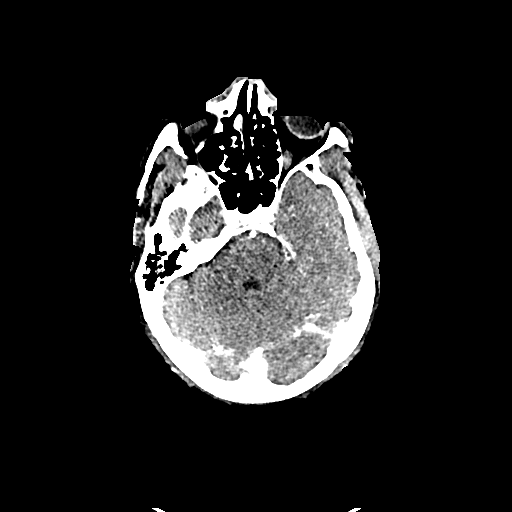
[im 138/166  brain]
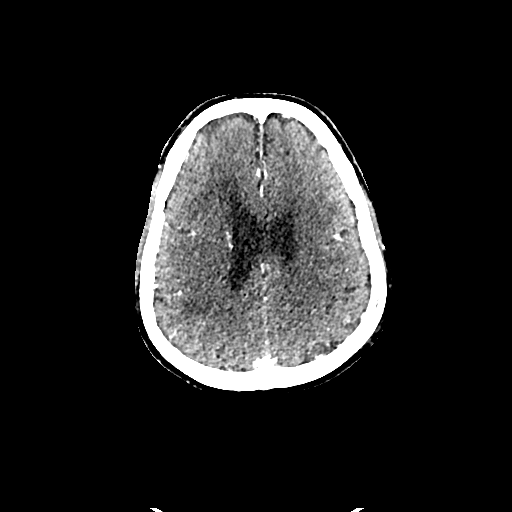

[Series 14: carotid mips (id) · axial · 0.49mm/px · z∈[-266,+79]mm · 3 of 70 slices shown]
[im 1/70  brain]
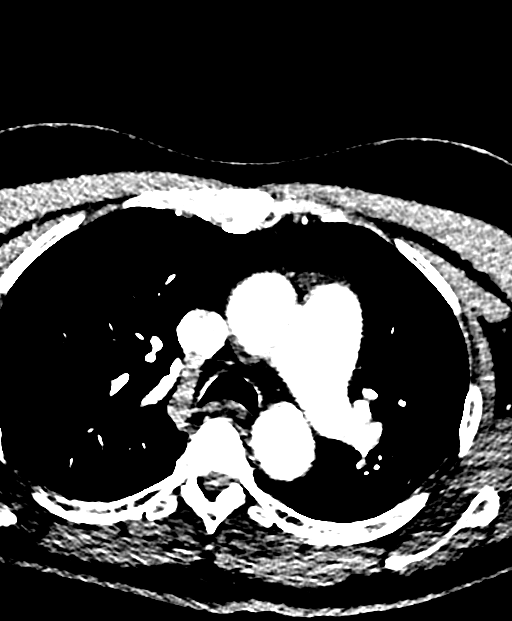
[im 35/70  bone]
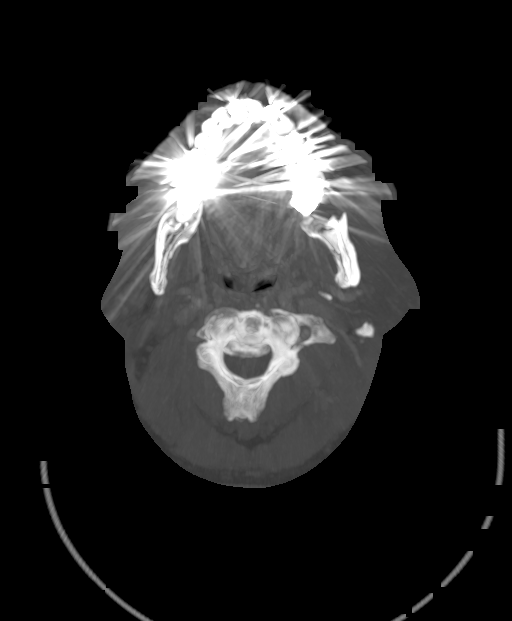
[im 70/70  brain]
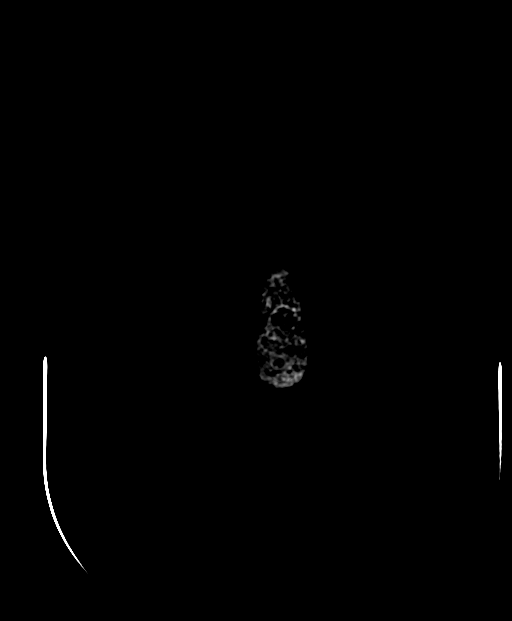

[8 of 47 positions shown; findings below may reference images not displayed]

FINDINGS: CT HEAD FINDINGS

Brain: No sign of acute infarction, mass lesion, hemorrhage,
hydrocephalus or extra-axial collection. Chronic small-vessel
ischemic changes of the cerebral hemispheric white matter.

Vascular: There is atherosclerotic calcification of the major
vessels at the base of the brain.

Skull: Negative

Sinuses: Clear/normal

Orbits: Normal

Review of the MIP images confirms the above findings

CTA NECK FINDINGS

Aortic arch: Aortic atherosclerosis. No aneurysm or dissection.
Common origin of the innominate artery and left common carotid
artery.

Right carotid system: Carotid artery widely patent to the
bifurcation. Carotid bifurcation widely patent without soft or
calcified plaque. Cervical ICA is normal.

Left carotid system: Common carotid artery widely patent to the
bifurcation. Carotid bifurcation is normal without soft or calcified
plaque. Cervical ICA is normal.

Vertebral arteries: Both vertebral artery origins are widely patent.
Both vertebral arteries appear normal through the cervical region to
the foramen magnum.

Skeleton: Ordinary mid cervical spondylosis.  No acute finding.

Other neck: No mass or lymphadenopathy.

Upper chest: Negative

Review of the MIP images confirms the above findings

CTA HEAD FINDINGS

Anterior circulation: Both internal carotid arteries are patent
through the skull base and siphon regions. There is siphon
atherosclerosis but no stenosis greater than 30% suspected. The
anterior and middle cerebral vessels are patent without proximal
stenosis, aneurysm or vascular malformation.

Posterior circulation: Both vertebral arteries patent through the
foramen magnum to the basilar. No basilar stenosis. Posterior
circulation branch vessels are normal.

Venous sinuses: Patent and normal.

Anatomic variants: None significant.

Delayed phase: No abnormal enhancement.

Review of the MIP images confirms the above findings
IMPRESSION: Negative examination. No evidence of significant atherosclerotic
disease. Both carotid bifurcations are normal. No evidence of
vascular dissection. No intracranial stenosis or occlusion.

No acute brain finding. Chronic appearing small-vessel ischemic
changes of the cerebral hemispheric white matter.

## 2021-02-15 ENCOUNTER — Other Ambulatory Visit: Payer: Self-pay

## 2021-02-15 ENCOUNTER — Ambulatory Visit
Admission: EM | Admit: 2021-02-15 | Discharge: 2021-02-15 | Disposition: A | Discharge status: Home / Self Care | Payer: Medicare Other | Attending: Family Medicine | Admitting: Family Medicine

## 2021-02-15 ENCOUNTER — Encounter: Payer: Self-pay | Admitting: Emergency Medicine

## 2021-02-15 DIAGNOSIS — H65193 Other acute nonsuppurative otitis media, bilateral: Secondary | ICD-10-CM | POA: Diagnosis not present

## 2021-02-15 DIAGNOSIS — J029 Acute pharyngitis, unspecified: Secondary | ICD-10-CM

## 2021-02-15 MED ORDER — PREDNISONE 20 MG PO TABS: 20.0000 mg | ORAL_TABLET | Freq: Every day | ORAL | 0 refills | Status: AC

## 2021-02-15 MED ORDER — AMOXICILLIN 875 MG PO TABS: 875.0000 mg | ORAL_TABLET | Freq: Two times a day (BID) | ORAL | 0 refills | Status: DC

## 2021-02-15 NOTE — ED Provider Notes (Signed)
[facility]    CSN: [id number] Arrival date & time: 02/15/21  1019      History   Chief Complaint No chief complaint on file.   HPI Karl Robinson is a 70 y.o. male.   HPI Sore throat and cough x 3 days.  He reports that the cough has been mostly related to drainage. He subsequently developed bilateral ear pain yesterday.  He is afebrile.  He is taking 2 home COVID test which both were negative.  Denies any shortness of breath or productive cough.  Past Medical History:  Diagnosis Date   Adrenal hyperplasia (HCC)    Anemia, unspecified    Arthropathy, unspecified, site unspecified    Benign hypertension, endocrine    Cervical stenosis of spine    Changes in vascular appearance of retina    CKD (chronic kidney disease) stage 3, GFR 30-59 ml/min (HCC)    [facility] (Dr. Patton Sallesocco)   Diverticulosis of colon (without mention of hemorrhage)    Esophageal reflux    Gout    HLD (hyperlipidemia)    Hyperaldosteronism, unspecified (HCC)    Hypertrophy of prostate without urinary obstruction and other lower urinary tract symptoms (LUTS)    Mixed hearing loss, unspecified    Obesity    Ruptured lumbar disc    L5-S1   Type II or unspecified type diabetes mellitus without mention of complication, not stated as uncontrolled    Unspecified vitamin D deficiency    Varicocele    Left    Patient Active Problem List   Diagnosis Date Noted   Cough 07/06/2013   Derangement of right acromioclavicular joint 06/19/2013   Gout    VITAMIN D DEFICIENCY 07/18/2009   OSTEOARTHROS UNSPEC WHETHER GEN/LOC UNSPEC SITE 07/11/2009   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 Leota Jacobsenurner-Normal   CARDIAC CATHETERIZATION  1989   Dr. Sena SlateGamble-WNL   CARDIOVASCULAR STRESS TEST  10/21/05   WNL, but had EKG changes   Cervical MRI  04/16/03   Stenosis of C4/5,C5/6 with Narrowing   EMG/NCS  05/16/03   RVL Normal, Borderline RCTS   ESOPHAGOGASTRODUODENOSCOPY  11/98   Gastritis; Duod ulcer   ESOPHAGOGASTRODUODENOSCOPY  05/06/01   Dr. Arty BaumgartnerSam [place]-Granular Mucosa Bx-Negative   FLEXIBLE SIGMOIDOSCOPY  07/18/98   Dr. Woodroe ModeForeman-Diverticula   Lumbosacral MRI  11/16/07   L5-S1 Disc Rupture   NECK SURGERY  10/26/03   Dr. Otelia SergeantNitka   Pelvic MRI  11/16/07   Normal   Renal Arterial US  08/10/03   Negative   ROTATOR CUFF REPAIR Right 01/17/04   Testicular US  09/07/00   Left-sided varicocele (small)   TYMPANIC MEMBRANE REPAIR Right 1972   Prosthesis   US ECHOCARDIOGRAPHY  12/07/00   EF 70 %       Home Medications    Prior to Admission medications   Medication Sig Start Date End Date Taking? Authorizing Provider  allopurinol (ZYLOPRIM) 100  MG tablet Take 100 mg by mouth daily. Pt takes with 300 mg to equal total dose of 400 mg   Yes [provider]  allopurinol (ZYLOPRIM) 300 MG tablet Take 300 mg by mouth daily. 100 mg with 300 mg to equal 400 mg total dose   Yes [provider]  amLODipine (NORVASC) 10 MG tablet Take 10 mg by mouth daily.   Yes [provider]  amoxicillin (AMOXIL) 875 MG tablet Take 1 tablet (875 mg total) by mouth 2 (two) times daily. 02/15/21  Yes Bing Neighbors, FNP  eplerenone (INSPRA) 25 MG tablet Take 25 mg by mouth 2 (two) times daily.    Yes [provider]  escitalopram (LEXAPRO) 10 MG tablet Take 10 mg by mouth daily.   Yes [provider]   finasteride (PROSCAR) 5 MG tablet Take 5 mg by mouth daily.   Yes [provider]  fluticasone (FLONASE) 50 MCG/ACT nasal spray Place 2 sprays into both nostrils daily.   Yes [provider]  lisinopril (PRINIVIL,ZESTRIL) 20 MG tablet Take 20 mg by mouth 2 (two) times daily.   Yes [provider]  methocarbamol (ROBAXIN) 500 MG tablet Take 1 tablet (500 mg total) by mouth 2 (two) times daily. 09/03/18  Yes Curatolo, Adam, DO  omeprazole (PRILOSEC) 40 MG capsule Take 40mg  twice daily for 10 days then once daily 07/06/13  Yes 07/08/13, MD  pantoprazole (PROTONIX) 40 MG tablet Take 40 mg by mouth daily.   Yes [provider]  prazosin (MINIPRESS) 2 MG capsule Take 4 mg by mouth at bedtime.   Yes [provider]  predniSONE (DELTASONE) 20 MG tablet Take 1 tablet (20 mg total) by mouth daily with breakfast for 5 days. 02/15/21 02/20/21 Yes 04/22/21, FNP  rosuvastatin (CRESTOR) 40 MG tablet Take 20 mg by mouth at bedtime.   Yes [provider]  aspirin 81 MG tablet Take 81 mg by mouth daily.    [provider]  Coenzyme Q10 (COQ-10) 10 MG CAPS Take 10 mg by mouth daily.    [provider]  diclofenac sodium (VOLTAREN) 1 % GEL Apply 1 application topically 3 (three) times daily. 06/21/13   14/3/14, MD  diltiazem [facility]) 120 MG 24 hr capsule Take 120 mg by mouth 2 (two) times daily.    [provider]  fluocinonide cream (LIDEX) 0.05 % Apply 1 application topically daily as needed. Rash    [provider]  hydrochlorothiazide (HYDRODIURIL) 25 MG tablet Take 25 mg by mouth daily.    [provider]  methylPREDNISolone (MEDROL DOSEPAK) 4 MG TBPK tablet As written in package 09/03/18   09/05/18, DO  Multiple Vitamin (MULTIVITAMIN) tablet Take 1 tablet by mouth daily.    [provider]  oxyCODONE (ROXICODONE) 5 MG immediate release tablet Take 1 tablet (5 mg total) by  mouth every 8 (eight) hours as needed for up to 12 doses for severe pain. 09/03/18   09/05/18, DO    Family History Family History  Problem Relation Age of Onset   Cirrhosis Father        ETOH   Hypertension Mother        Kidney transplant   Congestive Heart Failure Mother    Hypertension Brother    Drug abuse Brother    Stroke Maternal Grandmother     Social History Social History   Tobacco Use   Smoking status: Never   Smokeless tobacco: Never  Substance Use Topics   Alcohol use: No   Drug use: No     Allergies   Hydromorphone hcl and Oxycodone hcl   Review of Systems Review of Systems Pertinent negatives listed in HPI  Physical Exam Triage Vital Signs ED Triage Vitals  Enc Vitals Group     BP 02/15/21 1316 117/61     Pulse Rate 02/15/21 1316 75     Resp 02/15/21 1316 18     Temp 02/15/21 1316 98.1 F (36.7 C)     Temp Source 02/15/21 1316 Oral     SpO2 02/15/21 1316 98 %     Weight --      Height --      Head Circumference --      Peak Flow --      Pain Score 02/15/21 1317 6     Pain Loc --      Pain Edu? --      Excl. in GC? --    No data found.  Updated Vital Signs BP 117/61 (BP Location: Left Arm)   Pulse 75   Temp 98.1 F (36.7 C) (Oral)   Resp 18   SpO2 98%   Visual Acuity Right Eye Distance:   Left Eye Distance:   Bilateral Distance:    Right Eye Near:   Left Eye Near:    Bilateral Near:     Physical Exam  General Appearance:    Alert, acutely ill-appearing, cooperative, no distress  HENT:   Normocephalic, ears normal, nares mucosal edema with congestion, rhinorrhea, oropharynx non erythematous w/o exudate   Eyes:    PERRL, conjunctiva/corneas clear, EOM's intact       Lungs:     Clear to auscultation bilaterally, respirations unlabored  Heart:    Regular rate and rhythm  Neurologic:   Awake, alert, oriented x 3. No apparent focal neurological           defect.      UC Treatments / Results  Labs (all labs ordered  are listed, but only abnormal results are displayed) Labs Reviewed - No data to display  EKG   Radiology No results found.  Procedures Procedures (including critical care time)  Medications Ordered in UC Medications - No data to display  Initial Impression / Assessment and Plan / UC Course  I have reviewed the triage vital signs and the nursing notes.  Pertinent labs & imaging results that were available during my care of the patient were reviewed by me and considered in my medical decision making (see chart for details).     Patient presents today with acute otitis media in both ears and acute pharyngitis although no evidence of streptococcal infection given no exudate and no significant erythema.  Patient declined COVID test here he is taken 2 home COVID test which were both negative advised that these can result in a false reading.  Advised to symptoms persist or if symptoms worsen retesting with a home COVID test.  Treatment per discharge instructions.  Return precautions given Final Clinical Impressions(s) / UC Diagnoses   Final diagnoses:  Other non-recurrent acute nonsuppurative otitis media of both ears  Sore throat   Discharge Instructions   None    ED Prescriptions     Medication Sig Dispense Auth. Provider   amoxicillin (AMOXIL) 875 MG tablet Take 1 tablet (875 mg total) by mouth 2 (two) times daily. 20 tablet Bing Neighbors, FNP   predniSONE (DELTASONE) 20 MG tablet Take 1 tablet (  20 mg total) by mouth daily with breakfast for 5 days. 5 tablet Bing Neighbors, FNP      PDMP not reviewed this encounter.   Bing Neighbors, FNP 02/16/21 1540

## 2021-02-15 NOTE — ED Triage Notes (Addendum)
Sore throat and cough x 3 days. Two negative at home covid tests. No visible exudate on tonsils. Also complaining of pain in his ears bilaterally.

## 2022-09-28 ENCOUNTER — Encounter (HOSPITAL_COMMUNITY): Payer: Self-pay

## 2022-09-28 ENCOUNTER — Other Ambulatory Visit: Payer: Self-pay

## 2022-09-28 ENCOUNTER — Emergency Department (HOSPITAL_COMMUNITY): Payer: No Typology Code available for payment source

## 2022-09-28 ENCOUNTER — Inpatient Hospital Stay (HOSPITAL_COMMUNITY)
Admission: EM | Admit: 2022-09-28 | Discharge: 2022-10-01 | DRG: 291 | Disposition: A | Discharge status: Home / Self Care | Payer: No Typology Code available for payment source | Attending: Internal Medicine | Admitting: Internal Medicine

## 2022-09-28 DIAGNOSIS — E876 Hypokalemia: Secondary | ICD-10-CM | POA: Diagnosis present

## 2022-09-28 DIAGNOSIS — J9601 Acute respiratory failure with hypoxia: Secondary | ICD-10-CM | POA: Diagnosis not present

## 2022-09-28 DIAGNOSIS — Z7982 Long term (current) use of aspirin: Secondary | ICD-10-CM

## 2022-09-28 DIAGNOSIS — I132 Hypertensive heart and chronic kidney disease with heart failure and with stage 5 chronic kidney disease, or end stage renal disease: Secondary | ICD-10-CM | POA: Diagnosis not present

## 2022-09-28 DIAGNOSIS — H908 Mixed conductive and sensorineural hearing loss, unspecified: Secondary | ICD-10-CM | POA: Diagnosis present

## 2022-09-28 DIAGNOSIS — Z79899 Other long term (current) drug therapy: Secondary | ICD-10-CM

## 2022-09-28 DIAGNOSIS — M109 Gout, unspecified: Secondary | ICD-10-CM | POA: Diagnosis present

## 2022-09-28 DIAGNOSIS — D631 Anemia in chronic kidney disease: Secondary | ICD-10-CM | POA: Diagnosis present

## 2022-09-28 DIAGNOSIS — N184 Chronic kidney disease, stage 4 (severe): Secondary | ICD-10-CM

## 2022-09-28 DIAGNOSIS — E785 Hyperlipidemia, unspecified: Secondary | ICD-10-CM | POA: Diagnosis present

## 2022-09-28 DIAGNOSIS — Z1152 Encounter for screening for COVID-19: Secondary | ICD-10-CM

## 2022-09-28 DIAGNOSIS — E559 Vitamin D deficiency, unspecified: Secondary | ICD-10-CM | POA: Diagnosis present

## 2022-09-28 DIAGNOSIS — I5033 Acute on chronic diastolic (congestive) heart failure: Secondary | ICD-10-CM | POA: Diagnosis present

## 2022-09-28 DIAGNOSIS — Z823 Family history of stroke: Secondary | ICD-10-CM

## 2022-09-28 DIAGNOSIS — K219 Gastro-esophageal reflux disease without esophagitis: Secondary | ICD-10-CM | POA: Diagnosis present

## 2022-09-28 DIAGNOSIS — Z8249 Family history of ischemic heart disease and other diseases of the circulatory system: Secondary | ICD-10-CM

## 2022-09-28 DIAGNOSIS — I130 Hypertensive heart and chronic kidney disease with heart failure and stage 1 through stage 4 chronic kidney disease, or unspecified chronic kidney disease: Secondary | ICD-10-CM | POA: Diagnosis not present

## 2022-09-28 DIAGNOSIS — N400 Enlarged prostate without lower urinary tract symptoms: Secondary | ICD-10-CM | POA: Diagnosis present

## 2022-09-28 DIAGNOSIS — I5030 Unspecified diastolic (congestive) heart failure: Secondary | ICD-10-CM

## 2022-09-28 DIAGNOSIS — M4802 Spinal stenosis, cervical region: Secondary | ICD-10-CM | POA: Diagnosis present

## 2022-09-28 DIAGNOSIS — F4310 Post-traumatic stress disorder, unspecified: Secondary | ICD-10-CM | POA: Diagnosis present

## 2022-09-28 DIAGNOSIS — D509 Iron deficiency anemia, unspecified: Secondary | ICD-10-CM | POA: Diagnosis present

## 2022-09-28 DIAGNOSIS — E1122 Type 2 diabetes mellitus with diabetic chronic kidney disease: Secondary | ICD-10-CM | POA: Diagnosis present

## 2022-09-28 DIAGNOSIS — E278 Other specified disorders of adrenal gland: Secondary | ICD-10-CM | POA: Diagnosis present

## 2022-09-28 DIAGNOSIS — E2609 Other primary hyperaldosteronism: Secondary | ICD-10-CM | POA: Diagnosis present

## 2022-09-28 DIAGNOSIS — G4733 Obstructive sleep apnea (adult) (pediatric): Secondary | ICD-10-CM | POA: Diagnosis present

## 2022-09-28 DIAGNOSIS — E1140 Type 2 diabetes mellitus with diabetic neuropathy, unspecified: Secondary | ICD-10-CM | POA: Diagnosis present

## 2022-09-28 DIAGNOSIS — I3139 Other pericardial effusion (noninflammatory): Secondary | ICD-10-CM | POA: Diagnosis not present

## 2022-09-28 DIAGNOSIS — G4700 Insomnia, unspecified: Secondary | ICD-10-CM | POA: Diagnosis present

## 2022-09-28 DIAGNOSIS — I509 Heart failure, unspecified: Secondary | ICD-10-CM | POA: Diagnosis not present

## 2022-09-28 DIAGNOSIS — N185 Chronic kidney disease, stage 5: Secondary | ICD-10-CM | POA: Diagnosis present

## 2022-09-28 DIAGNOSIS — Z841 Family history of disorders of kidney and ureter: Secondary | ICD-10-CM

## 2022-09-28 DIAGNOSIS — Z886 Allergy status to analgesic agent status: Secondary | ICD-10-CM

## 2022-09-28 DIAGNOSIS — Z885 Allergy status to narcotic agent status: Secondary | ICD-10-CM

## 2022-09-28 DIAGNOSIS — Z813 Family history of other psychoactive substance abuse and dependence: Secondary | ICD-10-CM

## 2022-09-28 HISTORY — DX: Heart failure, unspecified: I50.9

## 2022-09-28 LAB — CBC WITH DIFFERENTIAL/PLATELET
Abs Immature Granulocytes: 0.01 10*3/uL (ref 0.00–0.07)
Basophils Absolute: 0 10*3/uL (ref 0.0–0.1)
Basophils Relative: 0 %
Eosinophils Absolute: 0.1 10*3/uL (ref 0.0–0.5)
Eosinophils Relative: 2 %
HCT: 31 % — ABNORMAL LOW (ref 39.0–52.0)
Hemoglobin: 10.6 g/dL — ABNORMAL LOW (ref 13.0–17.0)
Immature Granulocytes: 0 %
Lymphocytes Relative: 18 %
Lymphs Abs: 0.9 10*3/uL (ref 0.7–4.0)
MCH: 26.6 pg (ref 26.0–34.0)
MCHC: 34.2 g/dL (ref 30.0–36.0)
MCV: 77.7 fL — ABNORMAL LOW (ref 80.0–100.0)
Monocytes Absolute: 0.2 10*3/uL (ref 0.1–1.0)
Monocytes Relative: 5 %
Neutro Abs: 3.6 10*3/uL (ref 1.7–7.7)
Neutrophils Relative %: 75 %
Platelets: 191 10*3/uL (ref 150–400)
RBC: 3.99 MIL/uL — ABNORMAL LOW (ref 4.22–5.81)
RDW: 14.8 % (ref 11.5–15.5)
WBC: 4.8 10*3/uL (ref 4.0–10.5)
nRBC: 0 % (ref 0.0–0.2)

## 2022-09-28 LAB — TROPONIN I (HIGH SENSITIVITY): Troponin I (High Sensitivity): 27 ng/L — ABNORMAL HIGH (ref <18)

## 2022-09-28 LAB — BASIC METABOLIC PANEL
Anion gap: 18 — ABNORMAL HIGH (ref 5–15)
BUN: 31 mg/dL — ABNORMAL HIGH (ref 8–23)
CO2: 14 mmol/L — ABNORMAL LOW (ref 22–32)
Calcium: 8.8 mg/dL — ABNORMAL LOW (ref 8.9–10.3)
Chloride: 110 mmol/L (ref 98–111)
Creatinine, Ser: 4.33 mg/dL — ABNORMAL HIGH (ref 0.61–1.24)
GFR, Estimated: 14 mL/min — ABNORMAL LOW (ref >60)
Glucose, Bld: 119 mg/dL — ABNORMAL HIGH (ref 70–99)
Potassium: 4 mmol/L (ref 3.5–5.1)
Sodium: 142 mmol/L (ref 135–145)

## 2022-09-28 LAB — BRAIN NATRIURETIC PEPTIDE: B Natriuretic Peptide: 770.3 pg/mL — ABNORMAL HIGH (ref 0.0–100.0)

## 2022-09-28 MED ORDER — HEPARIN SODIUM (PORCINE) 5000 UNIT/ML IJ SOLN
5000.0000 [IU] | Freq: Three times a day (TID) | INTRAMUSCULAR | Status: DC
  Administered 2022-09-28 – 2022-10-01 (×8): 5000 [IU] via SUBCUTANEOUS
  Filled 2022-09-28 (×7): qty 1

## 2022-09-28 MED ORDER — ESCITALOPRAM OXALATE 10 MG PO TABS
10.0000 mg | ORAL_TABLET | Freq: Every day | ORAL | Status: DC
  Filled 2022-09-28 (×3): qty 1

## 2022-09-28 MED ORDER — FUROSEMIDE 10 MG/ML IJ SOLN
80.0000 mg | Freq: Once | INTRAMUSCULAR | Status: AC
  Administered 2022-09-28: 80 mg via INTRAVENOUS
  Filled 2022-09-28: qty 8

## 2022-09-28 MED ORDER — ACETAMINOPHEN 650 MG RE SUPP: 650.0000 mg | Freq: Four times a day (QID) | RECTAL | Status: DC | PRN

## 2022-09-28 MED ORDER — SENNOSIDES-DOCUSATE SODIUM 8.6-50 MG PO TABS: 1.0000 | ORAL_TABLET | Freq: Every evening | ORAL | Status: DC | PRN

## 2022-09-28 MED ORDER — ROSUVASTATIN CALCIUM 20 MG PO TABS: 20.0000 mg | ORAL_TABLET | Freq: Every day | ORAL | Status: DC

## 2022-09-28 MED ORDER — AMLODIPINE BESYLATE 10 MG PO TABS
10.0000 mg | ORAL_TABLET | Freq: Every day | ORAL | Status: DC
  Administered 2022-09-29 – 2022-10-01 (×3): 10 mg via ORAL
  Filled 2022-09-28: qty 1
  Filled 2022-09-28: qty 2
  Filled 2022-09-28: qty 1
  Filled 2022-09-28: qty 2

## 2022-09-28 MED ORDER — PANTOPRAZOLE SODIUM 40 MG PO TBEC
40.0000 mg | DELAYED_RELEASE_TABLET | Freq: Every day | ORAL | Status: DC
  Administered 2022-09-29 – 2022-10-01 (×3): 40 mg via ORAL
  Filled 2022-09-28 (×3): qty 1

## 2022-09-28 MED ORDER — ACETAMINOPHEN 325 MG PO TABS: 650.0000 mg | ORAL_TABLET | Freq: Four times a day (QID) | ORAL | Status: DC | PRN

## 2022-09-28 NOTE — H&P (Incomplete)
Date: 09/29/2022               Patient Name:  Karl Robinson MRN: [id number]  DOB: 1951/02/24 Age / Sex: 72 y.o., male   PCP: [facility] [place] Service: Internal Medicine Teaching Service         Attending Physician: Dr. Heber [place], Rachel Moulds, DO    First Contact: Dr. Angelique Blonder Pager: [phone]  Second Contact: Dr. Christiana Fuchs Pager: [phone]       After Hours (After 5p/  First Contact Pager: [phone]  weekends / holidays): Second Contact Pager: [phone]   Chief Complaint: exertional dyspnea    History of Present Illness:  Karl Robinson is a 72 y/o male with past medical history of HFpEF (EF > 55% in 02/2021), HTN, HLD, T2DM, CKD5, OSA (occasionally on CPAP, has not used in 2-3 months, causes nightmares with PTSD), GERD, BPH, DDD, PTSD, insomnia, adrenal hyperplasia, hyperaldosteronism, obesity, diverticulosis, gout, cervical stenosis, vitamin D deficiency, anemia, and mixed hearing loss that presents for exertional dyspnea.   Patient reports exertional dyspnea and a "rattling" sensation in his chest while breathing that began 4 days ago.  He reports that his dyspnea resolves upon resting.  Patient also endorses central chest pressure that is nonradiating and also resolves with relaxation over this period of time.  He has noticed increased lower extremity swelling over the last week.  He reached out to his primary care doctor regarding his dyspnea and lower extremity swelling, and was prescribed Lasix 40 mg daily.  He reports taking Lasix over the last 3 days which has improved his lower extremity swelling but has not helped his breathing much. He denies any other recent medication changes.  Patient also endorses chronic orthopnea.  He states that he must sleep with several pillows behind him.  He reports that he has been hospitalized previously which required diuresis. He endorses chills over the last few days.  Denies fevers, cough, sore throat, nausea, vomiting,  diarrhea, abdominal pain, or recent sick contacts.    Patient reports having repair of his LUE fistula within the last 2 weeks.  He does follow with nephrology at the [facility].  He is unsure of when he is supposed to start dialysis.  Patient denies palpitations, hemoptysis, dysuria, urinary frequency, headache, lightheadedness, dizziness, syncope. Denies recent prolonged immobilization or surgery. Denies past medical history of DVT, PE, MI, cancer.   ED Course:  Labs: -BMP significant for BUN elevated at 31, Cr elevated at 4.33, elevated AG at 18 -CBC significant for microcytic anemia w/ Hgb 10.6 -BNP elevated at 770  Imaging: -CXR significant for mild left basilar opacity suspected to represent atelectasis   EKG significant for RAD, normal sinus rhythm.   Interventions: Patient was given IV lasix 80 mg.    Review of Systems: A complete ROS was negative except as per HPI.   Meds:  -Acetaminophen 500 mg -Methocarbamol 500 mg BID PRN -Tramadol 50 mg QID PRN -Gabapentin 100 mg BID -Allopurinol 300 mg daily -Amlodipine 10 mg daily -Lisinopril 40 mg daily -Lasix 40 mg daily -Aspirin 81 mg daily -Rosuvastatin 20 mg daily -Clonidine 0.3 mg TID -Eplerenone 25 mg daily -Prazosin 2 mg daily -Finasteride 5 mg daily -Tamsulosin 0.4 mg BID -Famotidine 10 mg daily  -Pantoprazole 40 mg daily -Ferrous sulfate 325 mg MWF -Zofran 4 mg q6 hours PRN -Potassium chloride 20 mEQ BID -Coenzyme Q10 daily -Escitalopram 10 mg daily -Melatonin 1  mg daily -Diclofenac 1% gel -Fluocinonide 0.05% topical solution -Cholecalciferol 50 mcg (D3- 2000 unit) daily -Carboxymethylcellulose 0.25% ophthalmic solution --Psyllium oral powder -MiraLAX -Naloxone nasal spray  Allergies: Allergies as of 09/28/2022 - Review Complete 09/28/2022  Allergen Reaction Noted   Hydromorphone hcl  06/21/2007   Oxycodone hcl  02/22/2009   Past Medical History:  Diagnosis Date   Adrenal hyperplasia (HCC)    Anemia,  unspecified    Arthropathy, unspecified, site unspecified    Benign hypertension, endocrine    Cervical stenosis of spine    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 Joseph Berkshire)   Diverticulosis of colon (without mention of hemorrhage)    Esophageal reflux    Gout    HLD (hyperlipidemia)    Hyperaldosteronism, unspecified ([place])    Hypertrophy of prostate without urinary obstruction and other lower urinary tract symptoms (LUTS)    Mixed hearing loss, unspecified    Obesity    Ruptured lumbar disc    L5-S1   Type II or unspecified type diabetes mellitus without mention of complication, not stated as uncontrolled    Unspecified vitamin D deficiency    Varicocele    Left    Surgical History: bilateral rotator cuff surgery.  Family History: renal failure (mother). Patient denies family history of DM, MI, DVT, stroke, or cancer.   Social History: Currently lives with at home with his wife, daughter, and grandson. He is a retired Administrator. Independent of all ADLs/IADLs. Ambulates w/o assistance at baseline. Denies current or prior alcohol, tobacco, or recreational drug use.   Physical Exam: Blood pressure (!) 178/77, pulse 71, temperature 98.4 F (36.9 C), resp. rate (!) 23, height '5\' 9"'$  (1.753 m), weight 91.2 kg, SpO2 97 %. Constitutional: Well-developed, well-nourished, appears comfortable  HENT: Normocephalic and atraumatic.  Eyes: EOMI. PERRL.  Neck: Normal range of motion.  Cardiovascular: Regular rate, regular rhythm. No rubs or gallops. Normal radial and PT pulses bilaterally. 3/6 systolic murmur heard best at RUSB. JVD 1-2 cm above the sternal angle. 1+ LE edema.  Pulmonary: Normal respiratory effort. No wheezes, rales, rhonchi. Diminished breath sounds and minimal crackles at the left lung base. Abdominal: Soft. Non-distended. No tenderness.  Normal bowel sounds.  Musculoskeletal: Normal range of motion.     Neurological: Alert and oriented to person, place, and time. Non-focal. Skin: warm and dry. Normal skin turgor. AVF of RUE that is clean, dry, and intact. No ulcers of the lower extremities.   EKG: personally reviewed my interpretation is RAD, normal sinus rhythm.   CXR: personally reviewed my interpretation is mild left basilar opacity suspected to represent atelectasis   Assessment & Plan by Problem: Principal Problem:   Acute hypoxic respiratory failure (HCC)   Joshau Folkes is a 72 y/o male with past medical history of HFpEF (EF > 55% in 02/2021), HTN, HLD, T2DM, CKD4, OSA (occasionally CPAP use), GERD, BPH, DDD, PTSD, insomnia, adrenal hyperplasia, hyperaldosteronism, obesity, diverticulosis, gout, cervical stenosis, vitamin D deficiency, anemia, and mixed hearing loss that presents for exertional dyspnea and was admitted for acute hypoxic respiratory failure.   #Acute Hypoxic Respiratory Failure #Possible Stable Angina #HFpEF (EF > 55% in 02/2021) #OSA Patient presented w/ exertional dyspnea, "rattling" sensation in his chest when breathing, and lower extremity swelling. Also has chronic orthopnea. He was recently prescribed lasix 40 mg by his PCP  with improvement of his LE swelling. However, has not noticed improvement of his breathing. Initially required supplemental O2 while in the ED but was weaned to RA. Initial troponin elevated at 27. Repeat troponin flat at 36. EKG without signs of ischemia. Suspect troponin elevation likely 2/2 demand ischemia. BNP elevated at 770. On exam, patient has JVD and lower extremity swelling concerning for volume overload. Will obtain echo to assess for signs of worsening cardiac function. Would consult cardiology if echo demonstrates new abnormalities. Patient was given IV lasix 80 mg while in the ED w/ good UOP of 1.2 L. Suspect patient will benefit from further diuresis. Low suspicion for  PE at this time given wells score of zero. CXR significant for mild left basilar opacity suspected to represent atelectasis. Lower suspicion for pneumonia at this time given lack of fever or leukocytosis. Suspect CXR findings may be 2/2 volume overload. Recommend continuing to monitor for signs of developing infection. Patient also reports history of OSA. States he uses his CPAP occasionally and has not used it in 2-3 months. He states he is only able to tolerate 2-3 hours of CPAP at night due to it worsening his nightmares associated w/ PTSD. Suspect untreated OSA may be contributing to his presentation. Will trial CPAP at night. -Pending echo -Daily weights -Strict Is/Os -IV lasix  -CPAP PRN  #CKDIV Patient following with nephrology at the [facility]. Plan is for HD in the near future. Had placement of RUE AFV in 04/2022. Underwent fistulogram, PTA, and stent on 09/14/2022 for pulsatile dialysis fistula. Cr ~ 4.0 w/ GFR 15 in 08/2022. CMP here significant for Cr of 4.33 w/ GFR 14 here. No hyperkalemia, metabolic acidosis, uremia, or significant volume overload to suggest need for emergent dialysis. Will anticoagulate w/ heparin given poor renal function.  -Continue outpatient nephrology f/u -Trend BMP  #Primary Hyperaldosteronism #Adrenal Hyperplasia #HTN Adrenal hyperplasia diagnosed in 1997. Patient following w/ endocrinology as outpatient. He is prescribed Amlodipine 10 mg daily, Lisinopril 40 mg daily, and Eplerenone 25 mg daily. Hypertensive on admission w/ MAP in the low 100s. Will hold lisinopril and eplerenone at this time given concern for poor renal function. No hypokalemia at this time. -Supplement potassium as necessary -Continue amlodipine 10 mg daily -Trend BP  #Microcytic Anemia Patient is prescribed Ferrous sulfate 325 mg MWF. CBC w/ microcytic anemia and Hgb of 10.6 on admission. Iron panel obtained here demonstrates decreased iron at 44 and decreased TIBC at 245. Ferritin WNL.   -Continue ferrous sulfate 325 mg MWF -Trend CBC  #BPH Patient is prescribed Finasteride 5 mg daily and Tamsulosin 0.4 mg BID. No urinary symptoms at this time. 1.2 L UOP since admission.  -Continue Finasteride 5 mg daily -Continue to monitor for urinary symptoms  #T2DM A1c was 5.4% seven months ago. Patient is prescribed Gabapentin 100 mg BID at home. No complaints of neuropathy at this time.  -Consider restarting gabapentin if symptomatic   #HLD Lipid panel from 7 months ago WNL w/ LDL of 61. Patient taking Aspirin 81 mg daily and Rosuvastatin 20 mg daily at home.  -Continue rosuvastatin 20  mg daily  #PTSD #Insomnia Reports nightmares insomnia due to PTSD. Patient is prescribed Prazosin 2 mg daily, Escitalopram 10 mg daily, Clonidine 0.3 mg TID, and Melatonin 1 mg daily. -Continue home escitalopram -Continue home clonidine  -Continue melatonin  #DDD #Cervical stenosis Patient is prescribe Acetaminophen 500 mg, Methocarbamol 500 mg BID PRN, Tramadol 50 mg QID PRN, and lidocaine gel. No complaints of neck/back  pain at this time. -Tylenol PRN  #Vitamin D Deficiency  Patient is prescribed Cholecalciferol 50 mcg (D3- 2000 unit) daily. No recent vitamin D level on file. Recommend repeat vitamin D level as outpatient to assess need for vitamin D supplementation.  -Outpatient vitamin D level  #GERD Patient is prescribed Famotidine 10 mg daily and Pantoprazole 40 mg daily. Normal EGD in 05/2019. No complaints of acid reflux at this time.  -Continue protonix 40 mg   #Gout Patient is prescribed Allopurinol 400 mg daily. No signs of acute gout flare at this time. Given decreased renal function, will decrease allopurinol to 200 mg daily.  -Continue allopurinol 200 mg daily   Diet:  HH Bowel: senna VTE: heparin IVF: none Code: Full PT/OT recs: pending   Prior to Admission Living Arrangement: home w/ family Anticipated Discharge Location: TBD Barriers to Discharge: continued  management  Dispo: Admit patient to Observation with expected length of stay less than 2 midnights.  Signed: Starlyn Skeans, MD 09/29/2022, 1:41 AM  Pager: [phone] After 5pm on weekdays and 1pm on weekends: On Call pager: [phone]

## 2022-09-28 NOTE — ED Triage Notes (Signed)
Pt reports shortness of breath and feels a rattling in his chest onset 4 days ago. He went to the [place] in [place] today where he had a chest xray which showed "findings which could be seen with CHF including borderline enlarged heart size, small bilateral pleural effusions and pulmonary venous congestion.No discrete findings of pulmonary edema at this time. 2. Patchy left basilar opacity may reflect atelectasis related to small effusion, but pneumonia could appear similar in the appropriate clinical setting."

## 2022-09-28 NOTE — ED Provider Notes (Signed)
[facility] Provider Note   CSN: [id number] Arrival date & time: 09/28/22  1425     History  Chief Complaint  Patient presents with   Shortness of Breath    Nawaf Sisak Henegar is a 72 y.o. male.  HPI 72 year old male with a history of heart failure and CKD about to start dialysis presents with shortness of breath.  He was to come to the ER due to progressive shortness of breath despite starting Lasix.  He has been getting fistulas placed but they are not ready.  He has noticed leg swelling though that is gone down since he started taking 40 mg Lasix.  However he still short of breath on exertion and gets chest tightness when he walks.  He has not had a cough, sputum, or fever.  Home Medications Prior to Admission medications   Medication Sig Start Date End Date Taking? Authorizing Provider  allopurinol (ZYLOPRIM) 100 MG tablet Take 100 mg by mouth daily. Pt takes with 300 mg to equal total dose of 400 mg    [provider]  allopurinol (ZYLOPRIM) 300 MG tablet Take 300 mg by mouth daily. 100 mg with 300 mg to equal 400 mg total dose    [provider]  amLODipine (NORVASC) 10 MG tablet Take 10 mg by mouth daily.    [provider]  amoxicillin (AMOXIL) 875 MG tablet Take 1 tablet (875 mg total) by mouth 2 (two) times daily. 02/15/21   Scot Jun, NP  aspirin 81 MG tablet Take 81 mg by mouth daily.    [provider]  Coenzyme Q10 (COQ-10) 10 MG CAPS Take 10 mg by mouth daily.    [provider]  diclofenac sodium (VOLTAREN) 1 % GEL Apply 1 application topically 3 (three) times daily. 06/21/13   Ria Bush, MD  diltiazem [facility]) 120 MG 24 hr capsule Take 120 mg by mouth 2 (two) times daily.    [provider]  eplerenone (INSPRA) 25 MG tablet Take 25 mg by mouth 2 (two) times daily.     [provider]  escitalopram (LEXAPRO) 10 MG tablet Take 10 mg by mouth daily.     [provider]  finasteride (PROSCAR) 5 MG tablet Take 5 mg by mouth daily.    [provider]  fluocinonide cream (LIDEX) [id number] % Apply 1 application topically daily as needed. Rash    [provider]  fluticasone (FLONASE) 50 MCG/ACT nasal spray Place 2 sprays into both nostrils daily.    [provider]  hydrochlorothiazide (HYDRODIURIL) 25 MG tablet Take 25 mg by mouth daily.    [provider]  lisinopril (PRINIVIL,ZESTRIL) 20 MG tablet Take 20 mg by mouth 2 (two) times daily.    [provider]  methocarbamol (ROBAXIN) 500 MG tablet Take 1 tablet (500 mg total) by mouth 2 (two) times daily. 09/03/18   Curatolo, Adam, DO  methylPREDNISolone (MEDROL DOSEPAK) 4 MG TBPK tablet As written in package 09/03/18   Lennice Sites, DO  Multiple Vitamin (MULTIVITAMIN) tablet Take 1 tablet by mouth daily.    [provider]  omeprazole (PRILOSEC) 40 MG capsule Take '40mg'$  twice daily for 10 days then once daily 07/06/13   Ria Bush, MD  oxyCODONE (ROXICODONE) 5 MG immediate release tablet Take 1 tablet (5 mg total) by mouth every 8 (eight) hours as needed for up to 12 doses for severe pain. 09/03/18   Curatolo, Adam, DO  pantoprazole (  PROTONIX) 40 MG tablet Take 40 mg by mouth daily.    [provider]  prazosin (MINIPRESS) 2 MG capsule Take 4 mg by mouth at bedtime.    [provider]  rosuvastatin (CRESTOR) 40 MG tablet Take 20 mg by mouth at bedtime.    [provider]      Allergies    Hydromorphone hcl and Oxycodone hcl    Review of Systems   Review of Systems  Constitutional:  Negative for fever.  Respiratory:  Positive for chest tightness and shortness of breath. Negative for cough.   Cardiovascular:  Positive for leg swelling. Negative for chest pain.    Physical Exam Updated Vital Signs BP (!) 177/78   Pulse 78   Temp (!) 97.4 F (36.3 C) (Oral)   Resp 17   Ht '5\' 9"'$  (1.753 m)   Wt 91.2 kg    SpO2 98%   BMI 29.68 kg/m  Physical Exam Vitals and nursing note reviewed.  Constitutional:      Appearance: He is well-developed.  HENT:     Head: Normocephalic and atraumatic.  Cardiovascular:     Rate and Rhythm: Normal rate and regular rhythm.     Heart sounds: Normal heart sounds.  Pulmonary:     Effort: Pulmonary effort is normal.     Breath sounds: Examination of the right-lower field reveals decreased breath sounds. Examination of the left-lower field reveals wheezing and rales. Decreased breath sounds, wheezing and rales present.  Abdominal:     Palpations: Abdomen is soft.     Tenderness: There is no abdominal tenderness.  Musculoskeletal:     Right lower leg: Edema present.     Left lower leg: Edema present.  Skin:    General: Skin is warm and dry.  Neurological:     Mental Status: He is alert.     ED Results / Procedures / Treatments   Labs (all labs ordered are listed, but only abnormal results are displayed) Labs Reviewed  BASIC METABOLIC PANEL - Abnormal; Notable for the following components:      Result Value   CO2 14 (*)    Glucose, Bld 119 (*)    BUN 31 (*)    Creatinine, Ser 4.33 (*)    Calcium 8.8 (*)    GFR, Estimated 14 (*)    Anion gap 18 (*)    All other components within normal limits  CBC WITH DIFFERENTIAL/PLATELET - Abnormal; Notable for the following components:   RBC 3.99 (*)    Hemoglobin 10.6 (*)    HCT 31.0 (*)    MCV 77.7 (*)    All other components within normal limits  BRAIN NATRIURETIC PEPTIDE - Abnormal; Notable for the following components:   B Natriuretic Peptide 770.3 (*)    All other components within normal limits  RESP PANEL BY RT-PCR (RSV, FLU A&B, COVID)  RVPGX2  IRON AND TIBC  FERRITIN  TROPONIN I (HIGH SENSITIVITY)    EKG EKG Interpretation  Date/Time:  Monday September 28 2022 15:28:26 EDT Ventricular Rate:  65 PR Interval:  200 QRS Duration: 82 QT Interval:  418 QTC Calculation: 434 R Axis:   103 Text  Interpretation: Normal sinus rhythm Rightward axis Cannot rule out Anterior infarct , age undetermined  T wave changes improved when compared to 2002 Confirmed by Sherwood Gambler [phone]) on 09/28/2022 8:12:02 PM  Radiology DG Chest 2 View  Result Date: 09/28/2022 CLINICAL DATA:  Shortness of breath and chills. EXAM: CHEST -  2 VIEW COMPARISON:  10/24/2003 chest radiograph FINDINGS: The cardiomediastinal silhouette is unremarkable. Equivocal mild LEFT basilar opacity noted. There is no evidence of pulmonary mass, consolidation, pneumothorax or pleural effusion. No acute bony abnormalities are identified. IMPRESSION: Equivocal mild LEFT basilar opacity which may represent atelectasis or airspace disease/pneumonia. Electronically Signed   By: Margarette Canada M.D.   On: 09/28/2022 16:43    Procedures Procedures    Medications Ordered in ED Medications  amLODipine (NORVASC) tablet 10 mg (has no administration in time range)  rosuvastatin (CRESTOR) tablet 20 mg (has no administration in time range)  escitalopram (LEXAPRO) tablet 10 mg (has no administration in time range)  pantoprazole (PROTONIX) EC tablet 40 mg (has no administration in time range)  heparin injection 5,000 Units (5,000 Units Subcutaneous Given 09/28/22 2302)  acetaminophen (TYLENOL) tablet 650 mg (has no administration in time range)    Or  acetaminophen (TYLENOL) suppository 650 mg (has no administration in time range)  senna-docusate (Senokot-S) tablet 1 tablet (has no administration in time range)  furosemide (LASIX) injection 80 mg (80 mg Intravenous Given 09/28/22 2301)    ED Course/ Medical Decision Making/ A&P                             Medical Decision Making Amount and/or Complexity of Data Reviewed External Data Reviewed: notes. Labs:     Details: Kidney disease with a creatinine of 4.3, elevated from baseline here but suspect this is closer to his new baseline.  BNP elevated at 770. Radiology: independent  interpretation performed.    Details: No overt CHF ECG/medicine tests: ordered and independent interpretation performed.    Details: No acute ischemia  Risk Prescription drug management. Decision regarding hospitalization.   Sounds like patient is due to start dialysis though not quite there yet.  I suspect he is fluid overloaded is a combination of that and as well as CHF.  He was started on IV Lasix.  No current chest pain.  Doubt ACS.  This point he will need admission and I discussed with the internal medicine teaching service.        Final Clinical Impression(s) / ED Diagnoses Final diagnoses:  Acute on chronic congestive heart failure, unspecified heart failure type [facility])    Rx / DC Orders ED Discharge Orders     None         Sherwood Gambler, MD 09/28/22 2318

## 2022-09-28 NOTE — Hospital Course (Addendum)
ED findings: -BMP significant for BUN elevated at 31, Cr elevated at 4.33, elevated AG at 18 -CBC significant for microcytic anemia w/ Hgb 10.6 -BNP elevated at 770 -CXR significant for mild left basilar opacity suspected to represent atelectasis vs airspace disease/pneumonia -EKG significant for RAD, normal sinus rhythm  ED interventions: IV lasix 80 mg  Recent Discharge: -recently started on lasix (taking for 3 days) -Prior hospitalization for CHF exacerbation -Fistula repair last week -No other new medications -Unsure of when dialysis will start -Follows w/ nephrology -Follows w/ cardiology   [facility]  PMH: heart failure, CKD3, adrenal hyperplasia, HTN, cervical stenosis, diverticulosis, GERD, gout, HLD, hyperaldosteronism, BPH, mixed hearing loss, obesity, T2DM, vitamin D deficiency, OSA (occasionally on CPAP, has not in 2-3 months, causes nightmares with PTSD), PTSD -Denies past medical history of DVT, PE, MI, cancer  (+) SX: dyspnea on exertion (4 days), resolves at rest, CP worse with exertion (central, pressure, non-radiating, improves with relaxation), chills (last few days), lower extremity swelling (1 week)  (-) SX: palpitations, orthopnea, fevers, cough, sore throat, hemoptysis, nausea, vomiting, abdominal pain, diarrhea, dysuria, urinary frequency, headache, lightheadedness, dizziness, syncope, sick contacts, prolonged immobilization/surgery,   Surgical Hx: bilateral rotator cuff surgery,   Social Hx: lives at home with wife, daughter, and grandson, retired Administrator, independent of all IADLs, denies current or prior ETOH, tobacco, recreational drug use -*Live with, work, ADLs, ambulates w/, ETOH, tobacco, recreational drugs*  Family Hx: renal failure (mother),  -*MI, DVT, stroke, MI, cancer*

## 2022-09-28 NOTE — ED Provider Triage Note (Signed)
Emergency Medicine Provider Triage Evaluation Note  Karl Robinson , a 72 y.o. male  was evaluated in triage.  Pt complains of SHOB with chills. Went to [place] 3 days ago and started on fluid pills which helped with leg swelling but still SHOB. No CP, no fevers, no cough. Hx CHF Review of Systems  Positive:  Negative:   Physical Exam  BP (!) 150/68   Pulse 66   Temp 97.6 F (36.4 C) (Oral)   Resp 16   Ht '5\' 9"'$  (1.753 m)   Wt 91.2 kg   SpO2 100%   BMI 29.68 kg/m  Gen:   Awake, no distress   Resp:  Normal effort  MSK:   Moves extremities without difficulty  Other:    Medical Decision Making  Medically screening exam initiated at 3:32 PM.  Appropriate orders placed.  Karl Robinson was informed that the remainder of the evaluation will be completed by another provider, this initial triage assessment does not replace that evaluation, and the importance of remaining in the ED until their evaluation is complete.     Tacy Learn, PA-C 09/28/22 1533

## 2022-09-29 ENCOUNTER — Observation Stay (HOSPITAL_COMMUNITY): Payer: No Typology Code available for payment source

## 2022-09-29 DIAGNOSIS — N185 Chronic kidney disease, stage 5: Secondary | ICD-10-CM | POA: Diagnosis present

## 2022-09-29 DIAGNOSIS — Z813 Family history of other psychoactive substance abuse and dependence: Secondary | ICD-10-CM | POA: Diagnosis not present

## 2022-09-29 DIAGNOSIS — G4733 Obstructive sleep apnea (adult) (pediatric): Secondary | ICD-10-CM | POA: Diagnosis present

## 2022-09-29 DIAGNOSIS — D509 Iron deficiency anemia, unspecified: Secondary | ICD-10-CM | POA: Diagnosis present

## 2022-09-29 DIAGNOSIS — E559 Vitamin D deficiency, unspecified: Secondary | ICD-10-CM | POA: Diagnosis present

## 2022-09-29 DIAGNOSIS — I509 Heart failure, unspecified: Secondary | ICD-10-CM | POA: Diagnosis present

## 2022-09-29 DIAGNOSIS — E1140 Type 2 diabetes mellitus with diabetic neuropathy, unspecified: Secondary | ICD-10-CM | POA: Diagnosis present

## 2022-09-29 DIAGNOSIS — I132 Hypertensive heart and chronic kidney disease with heart failure and with stage 5 chronic kidney disease, or end stage renal disease: Secondary | ICD-10-CM | POA: Diagnosis present

## 2022-09-29 DIAGNOSIS — E1122 Type 2 diabetes mellitus with diabetic chronic kidney disease: Secondary | ICD-10-CM | POA: Diagnosis present

## 2022-09-29 DIAGNOSIS — E2609 Other primary hyperaldosteronism: Secondary | ICD-10-CM | POA: Diagnosis present

## 2022-09-29 DIAGNOSIS — N400 Enlarged prostate without lower urinary tract symptoms: Secondary | ICD-10-CM | POA: Diagnosis present

## 2022-09-29 DIAGNOSIS — I5033 Acute on chronic diastolic (congestive) heart failure: Secondary | ICD-10-CM | POA: Diagnosis present

## 2022-09-29 DIAGNOSIS — Z823 Family history of stroke: Secondary | ICD-10-CM | POA: Diagnosis not present

## 2022-09-29 DIAGNOSIS — M4802 Spinal stenosis, cervical region: Secondary | ICD-10-CM | POA: Diagnosis present

## 2022-09-29 DIAGNOSIS — D631 Anemia in chronic kidney disease: Secondary | ICD-10-CM | POA: Diagnosis present

## 2022-09-29 DIAGNOSIS — G4700 Insomnia, unspecified: Secondary | ICD-10-CM | POA: Diagnosis present

## 2022-09-29 DIAGNOSIS — F4310 Post-traumatic stress disorder, unspecified: Secondary | ICD-10-CM | POA: Diagnosis present

## 2022-09-29 DIAGNOSIS — J9601 Acute respiratory failure with hypoxia: Secondary | ICD-10-CM | POA: Diagnosis present

## 2022-09-29 DIAGNOSIS — Z1152 Encounter for screening for COVID-19: Secondary | ICD-10-CM | POA: Diagnosis not present

## 2022-09-29 DIAGNOSIS — E278 Other specified disorders of adrenal gland: Secondary | ICD-10-CM | POA: Diagnosis present

## 2022-09-29 DIAGNOSIS — E785 Hyperlipidemia, unspecified: Secondary | ICD-10-CM | POA: Diagnosis present

## 2022-09-29 DIAGNOSIS — K219 Gastro-esophageal reflux disease without esophagitis: Secondary | ICD-10-CM | POA: Diagnosis present

## 2022-09-29 DIAGNOSIS — E876 Hypokalemia: Secondary | ICD-10-CM | POA: Diagnosis present

## 2022-09-29 DIAGNOSIS — I3139 Other pericardial effusion (noninflammatory): Secondary | ICD-10-CM | POA: Diagnosis not present

## 2022-09-29 DIAGNOSIS — M109 Gout, unspecified: Secondary | ICD-10-CM | POA: Diagnosis present

## 2022-09-29 LAB — TROPONIN I (HIGH SENSITIVITY)
Troponin I (High Sensitivity): 36 ng/L — ABNORMAL HIGH (ref <18)
Troponin I (High Sensitivity): 34 ng/L — ABNORMAL HIGH (ref <18)

## 2022-09-29 LAB — BASIC METABOLIC PANEL
Anion gap: 10 (ref 5–15)
BUN: 30 mg/dL — ABNORMAL HIGH (ref 8–23)
CO2: 22 mmol/L (ref 22–32)
Calcium: 8.8 mg/dL — ABNORMAL LOW (ref 8.9–10.3)
Chloride: 110 mmol/L (ref 98–111)
Creatinine, Ser: 4.37 mg/dL — ABNORMAL HIGH (ref 0.61–1.24)
GFR, Estimated: 14 mL/min — ABNORMAL LOW (ref >60)
Glucose, Bld: 112 mg/dL — ABNORMAL HIGH (ref 70–99)
Potassium: 3.5 mmol/L (ref 3.5–5.1)
Sodium: 142 mmol/L (ref 135–145)

## 2022-09-29 LAB — CBC
HCT: 31.7 % — ABNORMAL LOW (ref 39.0–52.0)
Hemoglobin: 10.9 g/dL — ABNORMAL LOW (ref 13.0–17.0)
MCH: 26.3 pg (ref 26.0–34.0)
MCHC: 34.4 g/dL (ref 30.0–36.0)
MCV: 76.6 fL — ABNORMAL LOW (ref 80.0–100.0)
Platelets: 201 10*3/uL (ref 150–400)
RBC: 4.14 MIL/uL — ABNORMAL LOW (ref 4.22–5.81)
RDW: 14.8 % (ref 11.5–15.5)
WBC: 5.5 10*3/uL (ref 4.0–10.5)
nRBC: 0 % (ref 0.0–0.2)

## 2022-09-29 LAB — IRON AND TIBC
Iron: 44 ug/dL — ABNORMAL LOW (ref 45–182)
Saturation Ratios: 18 % (ref 17.9–39.5)
TIBC: 245 ug/dL — ABNORMAL LOW (ref 250–450)
UIBC: 201 ug/dL

## 2022-09-29 LAB — RESP PANEL BY RT-PCR (RSV, FLU A&B, COVID)  RVPGX2
Influenza A by PCR: NEGATIVE
Influenza B by PCR: NEGATIVE
Resp Syncytial Virus by PCR: NEGATIVE
SARS Coronavirus 2 by RT PCR: NEGATIVE

## 2022-09-29 LAB — ECHOCARDIOGRAM COMPLETE
AR max vel: 1.93 cm2
AV Area VTI: 1.83 cm2
AV Area mean vel: 1.83 cm2
AV Mean grad: 6 mmHg
AV Peak grad: 12.5 mmHg
Ao pk vel: 1.77 m/s
Area-P 1/2: 3.6 cm2
Height: 69 in
S' Lateral: 3.5 cm
Weight: 3216 oz

## 2022-09-29 LAB — FERRITIN: Ferritin: 290 ng/mL (ref 24–336)

## 2022-09-29 LAB — MAGNESIUM: Magnesium: 1.8 mg/dL (ref 1.7–2.4)

## 2022-09-29 MED ORDER — GABAPENTIN 100 MG PO CAPS
100.0000 mg | ORAL_CAPSULE | Freq: Two times a day (BID) | ORAL | Status: DC
  Administered 2022-09-29 – 2022-10-01 (×5): 100 mg via ORAL
  Filled 2022-09-29 (×5): qty 1

## 2022-09-29 MED ORDER — MAGNESIUM SULFATE 2 GM/50ML IV SOLN
2.0000 g | Freq: Once | INTRAVENOUS | Status: AC
  Administered 2022-09-29: 2 g via INTRAVENOUS
  Filled 2022-09-29: qty 50

## 2022-09-29 MED ORDER — MELATONIN 3 MG PO TABS
3.0000 mg | ORAL_TABLET | Freq: Every day | ORAL | Status: DC
  Administered 2022-09-29 – 2022-09-30 (×2): 3 mg via ORAL
  Filled 2022-09-29 (×2): qty 1

## 2022-09-29 MED ORDER — FERROUS SULFATE 325 (65 FE) MG PO TABS
325.0000 mg | ORAL_TABLET | ORAL | Status: DC
  Administered 2022-09-30: 325 mg via ORAL
  Filled 2022-09-29: qty 1

## 2022-09-29 MED ORDER — ASPIRIN 81 MG PO TBEC
81.0000 mg | DELAYED_RELEASE_TABLET | Freq: Every day | ORAL | Status: DC
  Administered 2022-09-29 – 2022-10-01 (×3): 81 mg via ORAL
  Filled 2022-09-29 (×3): qty 1

## 2022-09-29 MED ORDER — POTASSIUM CHLORIDE CRYS ER 10 MEQ PO TBCR
10.0000 meq | EXTENDED_RELEASE_TABLET | Freq: Once | ORAL | Status: AC
  Administered 2022-09-29: 10 meq via ORAL
  Filled 2022-09-29 (×2): qty 1

## 2022-09-29 MED ORDER — FUROSEMIDE 10 MG/ML IJ SOLN
60.0000 mg | Freq: Once | INTRAMUSCULAR | Status: AC
  Administered 2022-09-29: 60 mg via INTRAVENOUS
  Filled 2022-09-29: qty 6

## 2022-09-29 MED ORDER — CLONIDINE HCL 0.1 MG PO TABS
0.3000 mg | ORAL_TABLET | Freq: Three times a day (TID) | ORAL | Status: DC
  Administered 2022-09-29 – 2022-10-01 (×8): 0.3 mg via ORAL
  Filled 2022-09-29 (×9): qty 3

## 2022-09-29 MED ORDER — FINASTERIDE 5 MG PO TABS
5.0000 mg | ORAL_TABLET | Freq: Every day | ORAL | Status: DC
  Administered 2022-09-29 – 2022-10-01 (×3): 5 mg via ORAL
  Filled 2022-09-29 (×3): qty 1

## 2022-09-29 MED ORDER — ROSUVASTATIN CALCIUM 5 MG PO TABS
10.0000 mg | ORAL_TABLET | Freq: Every day | ORAL | Status: DC
  Administered 2022-09-29 – 2022-10-01 (×3): 10 mg via ORAL
  Filled 2022-09-29 (×3): qty 2

## 2022-09-29 MED ORDER — ALLOPURINOL 100 MG PO TABS
200.0000 mg | ORAL_TABLET | Freq: Every day | ORAL | Status: DC
  Administered 2022-09-29: 200 mg via ORAL
  Filled 2022-09-29: qty 2

## 2022-09-29 NOTE — ED Notes (Signed)
ED TO INPATIENT HANDOFF REPORT  ED Nurse Name and Phone #: Ellin Mayhew Name/Age/Gender Karl Robinson 72 y.o. male Room/Bed: 001C/001C  Code Status   Code Status: Full Code  Home/SNF/Other Home Patient oriented to: self, place, time, and situation Is this baseline? No   Triage Complete: Triage complete  Chief Complaint Acute hypoxic respiratory failure (HCC) [J96.01] Acute exacerbation of CHF (congestive heart failure) ([place]) [I50.9]  Triage Note Pt reports shortness of breath and feels a rattling in his chest onset 4 days ago. He went to the [facility] in [place] today where he had a chest xray which showed "findings which could be seen with CHF including borderline enlarged heart size, small bilateral pleural effusions and pulmonary venous congestion.No discrete findings of pulmonary edema at this time. 2. Patchy left basilar opacity may reflect atelectasis related to small effusion, but pneumonia could appear similar in the appropriate clinical setting."   Allergies Allergies  Allergen Reactions   Hydromorphone Hcl     REACTION: UNSPECIFIED   Oxycodone Hcl     REACTION: itch    Level of Care/Admitting Diagnosis ED Disposition     ED Disposition  Admit   Condition  --   Comment  Hospital Area: [place] [[id number]]  Level of Care: Telemetry Medical [[id number]]  May admit patient to [facility] or [facility] if equivalent level of care is available:: No  Covid Evaluation: Asymptomatic - no recent exposure (last 10 days) testing not required  Diagnosis: Acute exacerbation of CHF (congestive heart failure) [facility]) [id number]  Admitting Physician: Lucious Groves [[id number]]  Attending Physician: Lucious Groves [id number]  Certification:: I certify this patient will need inpatient services for at least 2 midnights  Estimated Length of Stay: 3          B Medical/Surgery History Past Medical History:  Diagnosis Date   Adrenal hyperplasia ([place])    Anemia,  unspecified    Arthropathy, unspecified, site unspecified    Benign hypertension, endocrine    Cervical stenosis of spine    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 Joseph Berkshire)   Diverticulosis of colon (without mention of hemorrhage)    Esophageal reflux    Gout    HLD (hyperlipidemia)    Hyperaldosteronism, unspecified ([facility])    Hypertrophy of prostate without urinary obstruction and other lower urinary tract symptoms (LUTS)    Mixed hearing loss, unspecified    Obesity    Ruptured lumbar disc    L5-S1   Type II or unspecified type diabetes mellitus without mention of complication, not stated as uncontrolled    Unspecified vitamin D deficiency    Varicocele    Left   Past Surgical History:  Procedure Laterality Date   Bone Scan  11/16/07   Normal   CARDIAC CATHETERIZATION  12/14/00   Dr. Gena Fray   CARDIAC CATHETERIZATION  1989   Dr. Jacelyn Pi   CARDIOVASCULAR STRESS TEST  10/21/05   WNL, but had EKG changes   Cervical MRI  04/16/03   Stenosis of C4/5,C5/6 with Narrowing   EMG/NCS  05/16/03   RVL Normal, Borderline RCTS   ESOPHAGOGASTRODUODENOSCOPY  11/98   Gastritis; Duod ulcer   ESOPHAGOGASTRODUODENOSCOPY  05/06/01   Dr. Kelton Pillar Mucosa Bx-Negative   FLEXIBLE SIGMOIDOSCOPY  07/18/98   Dr. Bernell List   Lumbosacral MRI  11/16/07   L5-S1 Disc Rupture   NECK SURGERY  10/26/03   Dr. Louanne Skye   Pelvic MRI  11/16/07   Normal   Renal Arterial US  08/10/03   Negative   ROTATOR CUFF REPAIR Right 01/17/04   Testicular US  09/07/00   Left-sided varicocele (small)   TYMPANIC MEMBRANE REPAIR Right 1972   Prosthesis   US ECHOCARDIOGRAPHY  12/07/00   EF 70 %     A IV Location/Drains/Wounds Patient Lines/Drains/Airways Status     Active Line/Drains/Airways     Name Placement date Placement time Site Days   Peripheral IV  09/03/18 Right Antecubital 09/03/18  0744  Antecubital  1487   Peripheral IV 09/28/22 20 G 2.5" Left Forearm 09/28/22  2241  Forearm  1            Intake/Output Last 24 hours  Intake/Output Summary (Last 24 hours) at 09/29/2022 1646 Last data filed at 09/29/2022 0538 Gross per 24 hour  Intake --  Output 2700 ml  Net -2700 ml    Labs/Imaging Results for orders placed or performed during the hospital encounter of 09/28/22 (from the past 48 hour(s))  Basic metabolic panel     Status: Abnormal   Collection Time: 09/28/22  3:45 PM  Result Value Ref Range   Sodium 142 135 - 145 mmol/L   Potassium 4.0 3.5 - 5.1 mmol/L   Chloride 110 98 - 111 mmol/L   CO2 14 (L) 22 - 32 mmol/L   Glucose, Bld 119 (H) 70 - 99 mg/dL    Comment: Glucose reference range applies only to samples taken after fasting for at least 8 hours.   BUN 31 (H) 8 - 23 mg/dL   Creatinine, Ser 4.33 (H) 0.61 - 1.24 mg/dL   Calcium 8.8 (L) 8.9 - 10.3 mg/dL   GFR, Estimated 14 (L) >60 mL/min    Comment: (NOTE) Calculated using the CKD-EPI Creatinine Equation (2021)    Anion gap 18 (H) 5 - 15    Comment: Performed at [facility] [address], [place], [place] [address]  CBC with Differential     Status: Abnormal   Collection Time: 09/28/22  3:45 PM  Result Value Ref Range   WBC 4.8 4.0 - 10.5 K/uL   RBC 3.99 (L) 4.22 - 5.81 MIL/uL   Hemoglobin 10.6 (L) 13.0 - 17.0 g/dL   HCT 31.0 (L) 39.0 - 52.0 %   MCV 77.7 (L) 80.0 - 100.0 fL   MCH 26.6 26.0 - 34.0 pg   MCHC 34.2 30.0 - 36.0 g/dL   RDW 14.8 11.5 - 15.5 %   Platelets 191 150 - 400 K/uL   nRBC 0.0 0.0 - 0.2 %   Neutrophils Relative % 75 %   Neutro Abs 3.6 1.7 - 7.7 K/uL   Lymphocytes Relative 18 %   Lymphs Abs 0.9 0.7 - 4.0 K/uL   Monocytes Relative 5 %   Monocytes Absolute 0.2 0.1 - 1.0 K/uL   Eosinophils Relative 2 %   Eosinophils Absolute 0.1 0.0 - 0.5 K/uL   Basophils Relative 0 %   Basophils Absolute 0.0 0.0 - 0.1 K/uL   Immature  Granulocytes 0 %   Abs Immature Granulocytes 0.01 0.00 - 0.07 K/uL    Comment: Performed at [facility] [address] [place], [place] [address]  Brain natriuretic peptide     Status: Abnormal   Collection Time: 09/28/22  3:45 PM  Result Value Ref Range  B Natriuretic Peptide 770.3 (H) 0.0 - 100.0 pg/mL    Comment: Performed at [facility] [address], [place], [place] [address]  Resp panel by RT-PCR (RSV, Flu A&B, Covid) Anterior Nasal Swab     Status: None   Collection Time: 09/28/22 10:06 PM   Specimen: Anterior Nasal Swab  Result Value Ref Range   SARS Coronavirus 2 by RT PCR NEGATIVE NEGATIVE   Influenza A by PCR NEGATIVE NEGATIVE   Influenza B by PCR NEGATIVE NEGATIVE    Comment: (NOTE) The Xpert Xpress SARS-CoV-2/FLU/RSV plus assay is intended as an aid in the diagnosis of influenza from Nasopharyngeal swab specimens and should not be used as a sole basis for treatment. Nasal washings and aspirates are unacceptable for Xpert Xpress SARS-CoV-2/FLU/RSV testing.  Fact Sheet for Patients: EntrepreneurPulse.com.au  Fact Sheet for Healthcare Providers: IncredibleEmployment.be  This test is not yet approved or cleared by the [place] FDA and has been authorized for detection and/or diagnosis of SARS-CoV-2 by FDA under an Emergency Use Authorization (EUA). This EUA will remain in effect (meaning this test can be used) for the duration of the COVID-19 declaration under Section 564(b)(1) of the Act, 21 U.S.C. section 360bbb-3(b)(1), unless the authorization is terminated or revoked.     Resp Syncytial Virus by PCR NEGATIVE NEGATIVE    Comment: (NOTE) Fact Sheet for Patients: EntrepreneurPulse.com.au  Fact Sheet for Healthcare Providers: IncredibleEmployment.be  This test is not yet approved or cleared by the [place] FDA and has been authorized for detection and/or  diagnosis of SARS-CoV-2 by FDA under an Emergency Use Authorization (EUA). This EUA will remain in effect (meaning this test can be used) for the duration of the COVID-19 declaration under Section 564(b)(1) of the Act, 21 U.S.C. section 360bbb-3(b)(1), unless the authorization is terminated or revoked.  Performed at [facility], [place] [address], [place], [place] [address]   Troponin I (High Sensitivity)     Status: Abnormal   Collection Time: 09/28/22 11:00 PM  Result Value Ref Range   Troponin I (High Sensitivity) 27 (H) <18 ng/L    Comment: (NOTE) Elevated high sensitivity troponin I (hsTnI) values and significant  changes across serial measurements may suggest ACS but many other  chronic and acute conditions are known to elevate hsTnI results.  Refer to the "Links" section for chest pain algorithms and additional  guidance. Performed at [facility], [place] [address], [place], [place] [address]   Iron and TIBC     Status: Abnormal   Collection Time: 09/28/22 11:00 PM  Result Value Ref Range   Iron 44 (L) 45 - 182 ug/dL   TIBC 245 (L) 250 - 450 ug/dL   Saturation Ratios 18 17.9 - 39.5 %   UIBC 201 ug/dL    Comment: Performed at [facility] [address], [place], [place] [address]  Ferritin     Status: None   Collection Time: 09/28/22 11:00 PM  Result Value Ref Range   Ferritin 290 24 - 336 ng/mL    Comment: Performed at [facility] [address] [place],  [address]  Troponin I (High Sensitivity)     Status: Abnormal   Collection Time: 09/29/22 12:49 AM  Result Value Ref Range   Troponin I (High Sensitivity) 36 (H) <18 ng/L    Comment: (NOTE) Elevated high sensitivity troponin I (hsTnI) values and significant  changes across serial measurements may suggest ACS but many other  chronic and acute conditions are known to elevate hsTnI results.  Refer to the "Links" section for chest pain algorithms and additional  guidance. Performed at  [facility], [place] [address], [place], [place] [id number]   Basic metabolic panel     Status: Abnormal   Collection Time: 09/29/22 12:49 AM  Result Value Ref Range   Sodium 142 135 - 145 mmol/L   Potassium 3.5 3.5 - 5.1 mmol/L   Chloride 110 98 - 111 mmol/L   CO2 22 22 - 32 mmol/L   Glucose, Bld 112 (H) 70 - 99 mg/dL    Comment: Glucose reference range applies only to samples taken after fasting for at least 8 hours.   BUN 30 (H) 8 - 23 mg/dL   Creatinine, Ser 4.37 (H) 0.61 - 1.24 mg/dL   Calcium 8.8 (L) 8.9 - 10.3 mg/dL   GFR, Estimated 14 (L) >60 mL/min    Comment: (NOTE) Calculated using the CKD-EPI Creatinine Equation (2021)    Anion gap 10 5 - 15    Comment: Performed at [facility] [address], [place], [place] [address]  CBC     Status: Abnormal   Collection Time: 09/29/22 12:49 AM  Result Value Ref Range   WBC 5.5 4.0 - 10.5 K/uL   RBC 4.14 (L) 4.22 - 5.81 MIL/uL   Hemoglobin 10.9 (L) 13.0 - 17.0 g/dL   HCT 31.7 (L) 39.0 - 52.0 %   MCV 76.6 (L) 80.0 - 100.0 fL   MCH 26.3 26.0 - 34.0 pg   MCHC 34.4 30.0 - 36.0 g/dL   RDW 14.8 11.5 - 15.5 %   Platelets 201 150 - 400 K/uL   nRBC 0.0 0.0 - 0.2 %    Comment: Performed at [facility], [place] [address] [place], [place] [address]  Magnesium     Status: None   Collection Time: 09/29/22 12:49 AM  Result Value Ref Range   Magnesium 1.8 1.7 - 2.4 mg/dL    Comment: Performed at [facility] [address], [place], [place] [address]  Troponin I (High Sensitivity)     Status: Abnormal   Collection Time: 09/29/22 12:49 AM  Result Value Ref Range   Troponin I (High Sensitivity) 34 (H) <18 ng/L    Comment: (NOTE) Elevated high sensitivity troponin I (hsTnI) values and significant  changes across serial measurements may suggest ACS but many other  chronic and acute conditions are known to elevate hsTnI results.  Refer to the "Links" section for chest pain algorithms and additional   guidance. Performed at [facility], [place] [address], [place], [place] [address]    ECHOCARDIOGRAM COMPLETE  Result Date: 09/29/2022    ECHOCARDIOGRAM REPORT   Patient Name:   Karl Robinson Date of Exam: 09/29/2022 Medical Rec #:  [id number]      Height:       69.0 in Accession #:    [id number]     Weight:       201.0 lb Date of Birth:  1951-03-27      BSA:          2.070 m Patient Age:    52 years       BP:           160/64 mmHg Patient Gender: M              HR:           65  bpm. Exam Location:  Inpatient Procedure: 2D Echo, Color Doppler and Cardiac Doppler Indications:    CHF  History:        Patient has no prior history of Echocardiogram examinations.                 CHF; Risk Factors:Diabetes and Dyslipidemia.  Sonographer:    Meagan Baucom RDCS, FE, PE Referring Phys: [place]  1. Left ventricular ejection fraction, by estimation, is 60 to 65%. The left ventricle has normal function. The left ventricle has no regional wall motion abnormalities. There is moderate asymmetric left ventricular hypertrophy of the inferior segment. Left ventricular diastolic parameters are consistent with Grade II diastolic dysfunction (pseudonormalization).  2. Right ventricular systolic function is normal. The right ventricular size is normal.  3. Left atrial size was mildly dilated.  4. A small pericardial effusion is present. The pericardial effusion is circumferential.  5. The mitral valve is normal in structure. Mild to moderate mitral valve regurgitation. No evidence of mitral stenosis.  6. The aortic valve is tricuspid. Aortic valve regurgitation is not visualized.  7. The inferior vena cava is normal in size with greater than 50% respiratory variability, suggesting right atrial pressure of 3 mmHg. Comparison(s): Unable to view prior. FINDINGS  Left Ventricle: In the setting of elevated posterior thickness, pericardial effusion, and E/e' values, outpatient pyrophospate imaging may be  considered. Left ventricular ejection fraction, by estimation, is 60 to 65%. The left ventricle has normal function. The left ventricle has no regional wall motion abnormalities. The left ventricular internal cavity size was normal in size. There is moderate asymmetric left ventricular hypertrophy of the inferior segment. Left ventricular diastolic parameters  are consistent with Grade II diastolic dysfunction (pseudonormalization). Right Ventricle: The right ventricular size is normal. No increase in right ventricular wall thickness. Right ventricular systolic function is normal. Left Atrium: Left atrial size was mildly dilated. Right Atrium: Right atrial size was normal in size. Pericardium: A small pericardial effusion is present. The pericardial effusion is circumferential. Mitral Valve: The mitral valve is normal in structure. Mild to moderate mitral valve regurgitation. No evidence of mitral valve stenosis. Tricuspid Valve: The tricuspid valve is normal in structure. Tricuspid valve regurgitation is mild . No evidence of tricuspid stenosis. Aortic Valve: The aortic valve is tricuspid. Aortic valve regurgitation is not visualized. Aortic valve mean gradient measures 6.0 mmHg. Aortic valve peak gradient measures 12.5 mmHg. Aortic valve area, by VTI measures 1.83 cm. Pulmonic Valve: The pulmonic valve was normal in structure. Pulmonic valve regurgitation is not visualized. No evidence of pulmonic stenosis. Aorta: The aortic root and ascending aorta are structurally normal, with no evidence of dilitation. Venous: The inferior vena cava is normal in size with greater than 50% respiratory variability, suggesting right atrial pressure of 3 mmHg. IAS/Shunts: No atrial level shunt detected by color flow Doppler.  LEFT VENTRICLE PLAX 2D LVIDd:         5.40 cm   Diastology LVIDs:         3.50 cm   LV e' medial:    6.37 cm/s LV PW:         1.20 cm   LV E/e' medial:  14.2 LV IVS:        1.00 cm   LV e' lateral:   8.39  cm/s LVOT diam:     1.80 cm   LV E/e' lateral: 10.8 LV SV:         69 LV SV  Index:   34 LVOT Area:     2.54 cm  RIGHT VENTRICLE RV S prime:     13.80 cm/s TAPSE (M-mode): 1.9 cm LEFT ATRIUM             Index        RIGHT ATRIUM           Index LA diam:        4.50 cm 2.17 cm/m   RA Area:     22.30 cm LA Vol (A2C):   73.1 ml 35.31 ml/m  RA Volume:   63.40 ml  30.62 ml/m LA Vol (A4C):   84.7 ml 40.91 ml/m LA Biplane Vol: 81.9 ml 39.56 ml/m  AORTIC VALVE AV Area (Vmax):    1.93 cm AV Area (Vmean):   1.83 cm AV Area (VTI):     1.83 cm AV Vmax:           177.00 cm/s AV Vmean:          115.000 cm/s AV VTI:            0.380 m AV Peak Grad:      12.5 mmHg AV Mean Grad:      6.0 mmHg LVOT Vmax:         134.00 cm/s LVOT Vmean:        82.700 cm/s LVOT VTI:          0.273 m LVOT/AV VTI ratio: 0.72  AORTA Ao Root diam: 2.70 cm Ao Asc diam:  3.20 cm MITRAL VALVE               TRICUSPID VALVE MV Area (PHT): 3.60 cm    TR Peak grad:   29.4 mmHg MV Decel Time: 211 msec    TR Vmax:        271.00 cm/s MV E velocity: 90.70 cm/s MV A velocity: 77.10 cm/s  SHUNTS MV E/A ratio:  1.18        Systemic VTI:  0.27 m                            Systemic Diam: 1.80 cm Rudean Haskell MD Electronically signed by Rudean Haskell MD Signature Date/Time: 09/29/2022/1:47:54 PM    Final    DG Chest 2 View  Result Date: 09/28/2022 CLINICAL DATA:  Shortness of breath and chills. EXAM: CHEST - 2 VIEW COMPARISON:  10/24/2003 chest radiograph FINDINGS: The cardiomediastinal silhouette is unremarkable. Equivocal mild LEFT basilar opacity noted. There is no evidence of pulmonary mass, consolidation, pneumothorax or pleural effusion. No acute bony abnormalities are identified. IMPRESSION: Equivocal mild LEFT basilar opacity which may represent atelectasis or airspace disease/pneumonia. Electronically Signed   By: Margarette Canada M.D.   On: 09/28/2022 16:43    Pending Labs Unresulted Labs (From admission, onward)     Start     Ordered    09/30/22 [id number]  Basic metabolic panel  Tomorrow morning,   R        09/29/22 1459   09/30/22 0500  Magnesium  Tomorrow morning,   R        09/29/22 1459            Vitals/Pain Today's Vitals   09/29/22 0945 09/29/22 1028 09/29/22 1447 09/29/22 1640  BP:  (!) 160/64  (!) 172/70  Pulse: 70 62  63  Resp: '17 18  20  '$ Temp:  98.4 F (36.9 C) 98.6 F (37 C) 98 F (  36.7 C)  TempSrc:    Oral  SpO2: 94% 97%  96%  Weight:      Height:      PainSc:        Isolation Precautions No active isolations  Medications Medications  amLODipine (NORVASC) tablet 10 mg (10 mg Oral Given 09/29/22 0942)  escitalopram (LEXAPRO) tablet 10 mg (10 mg Oral Patient Refused/Not Given 09/29/22 0942)  pantoprazole (PROTONIX) EC tablet 40 mg (40 mg Oral Given 09/29/22 0943)  heparin injection 5,000 Units (5,000 Units Subcutaneous Given 09/29/22 1252)  acetaminophen (TYLENOL) tablet 650 mg (has no administration in time range)    Or  acetaminophen (TYLENOL) suppository 650 mg (has no administration in time range)  senna-docusate (Senokot-S) tablet 1 tablet (has no administration in time range)  cloNIDine (CATAPRES) tablet 0.3 mg (0.3 mg Oral Given 09/29/22 1526)  aspirin EC tablet 81 mg (81 mg Oral Given 09/29/22 0942)  melatonin tablet 3 mg (3 mg Oral Not Given 09/29/22 0310)  ferrous sulfate tablet 325 mg (has no administration in time range)  finasteride (PROSCAR) tablet 5 mg (5 mg Oral Given 09/29/22 0942)  gabapentin (NEURONTIN) capsule 100 mg (100 mg Oral Given 09/29/22 1440)  rosuvastatin (CRESTOR) tablet 10 mg (10 mg Oral Given 09/29/22 1440)  furosemide (LASIX) injection 80 mg (80 mg Intravenous Given 09/28/22 2301)  potassium chloride (KLOR-CON M) CR tablet 10 mEq (10 mEq Oral Given 09/29/22 1016)  furosemide (LASIX) injection 60 mg (60 mg Intravenous Given 09/29/22 0943)  magnesium sulfate IVPB 2 g 50 mL (0 g Intravenous Stopped 09/29/22 1058)    Mobility walks     Focused Assessments Cardiac  Assessment Handoff:    No results found for: "CKTOTAL", "CKMB", "CKMBINDEX", "TROPONINI" No results found for: "DDIMER" Does the Patient currently have chest pain? No    R Recommendations: See Admitting Provider Note  Report given to:   Additional Notes: Pt ambulated to restroom with no distress

## 2022-09-29 NOTE — ED Notes (Signed)
Admission team at bedside.

## 2022-09-29 NOTE — ED Notes (Signed)
Pt resting on stretcher NAD reports that he feels much better wife at bedside

## 2022-09-29 NOTE — ED Notes (Signed)
Pt was able to ambulate to bathroom with NAD and no assistance

## 2022-09-29 NOTE — Progress Notes (Addendum)
HD#0 Subjective:   Summary: Karl Robinson is a 72 y.o. male with PMH of  HFpEF (EF > 55% in 02/2021), HTN, HLD, T2DM, CKD5, OSA (occasionally on CPAP, has not used in 2-3 months, causes nightmares with PTSD), GERD, BPH, DDD, PTSD, insomnia, adrenal hyperplasia, hyperaldosteronism, obesity, diverticulosis, gout, cervical stenosis, vitamin D deficiency, anemia, and mixed hearing loss  who presents with exertional dyspnea and admitted for acute decompensated diastolic heart failure.  Overnight Events: 3 L urine output  He no longer has chest pressure or weird feeling in his chest. His legs feel much better in terms of swelling as well this morning.   Objective:  Vital signs in last 24 hours: Vitals:   09/29/22 0300 09/29/22 0330 09/29/22 0400 09/29/22 0608  BP: (!) 165/62 (!) 166/60 (!) 181/70   Pulse: 66 62 62 63  Resp: '20 19 20 '$ (!) 24  Temp:    98.1 F (36.7 C)  TempSrc:      SpO2: 97% 94% 94% 98%  Weight:      Height:       Supplemental O2: Room Air SpO2: 98 %   Physical Exam:  Constitutional: awake, laying in bed comfortably, in no acute distress HENT: normocephalic atraumatic Cardiovascular: regular rate and rhythm,  Pulmonary/Chest: normal work of breathing on room air, lungs clear to auscultation bilaterally  Abdominal: soft, non-tender, non-distended MSK: 1+ pitting LE edema to knee bilaterally Skin: warm and dry, RUE AVF is clean, dry and intact with palpable thrill  Psych: pleasant mood  Filed Weights   09/28/22 1527  Weight: 91.2 kg     Intake/Output Summary (Last 24 hours) at 09/29/2022 0708 Last data filed at 09/29/2022 0538 Gross per 24 hour  Intake --  Output 2700 ml  Net -2700 ml   Net IO Since Admission: -2,700 mL [09/29/22 0708]  Pertinent Labs:    Latest Ref Rng & Units 09/29/2022   12:49 AM 09/28/2022    3:45 PM 09/03/2018    7:24 AM  CBC  WBC 4.0 - 10.5 K/uL 5.5  4.8  6.5   Hemoglobin 13.0 - 17.0 g/dL 10.9  10.6  12.5   Hematocrit 39.0 -  52.0 % 31.7  31.0  38.6   Platelets 150 - 400 K/uL 201  191  168        Latest Ref Rng & Units 09/29/2022   12:49 AM 09/28/2022    3:45 PM 09/03/2018    7:24 AM  CMP  Glucose 70 - 99 mg/dL 112  119  118   BUN 8 - 23 mg/dL '30  31  15   '$ Creatinine 0.61 - 1.24 mg/dL 4.37  4.33  2.03   Sodium 135 - 145 mmol/L 142  142  139   Potassium 3.5 - 5.1 mmol/L 3.5  4.0  3.8   Chloride 98 - 111 mmol/L 110  110  105   CO2 22 - 32 mmol/L '22  14  25   '$ Calcium 8.9 - 10.3 mg/dL 8.8  8.8  9.0     Imaging: DG Chest 2 View  Result Date: 09/28/2022 CLINICAL DATA:  Shortness of breath and chills. EXAM: CHEST - 2 VIEW COMPARISON:  10/24/2003 chest radiograph FINDINGS: The cardiomediastinal silhouette is unremarkable. Equivocal mild LEFT basilar opacity noted. There is no evidence of pulmonary mass, consolidation, pneumothorax or pleural effusion. No acute bony abnormalities are identified. IMPRESSION: Equivocal mild LEFT basilar opacity which may represent atelectasis or airspace disease/pneumonia. Electronically Signed  By: Margarette Canada M.D.   On: 09/28/2022 16:43    Assessment/Plan:   Principal Problem:   Acute hypoxic respiratory failure ([place])   Patient Summary: Karl Robinson is a 72 y.o. with a pertinent PMH of HFpEF (EF > 55% in 02/2021), HTN, HLD, T2DM, CKD5, OSA (occasionally on CPAP, has not used in 2-3 months, causes nightmares with PTSD), GERD, BPH, DDD, PTSD, insomnia, adrenal hyperplasia, hyperaldosteronism, obesity, diverticulosis, gout, cervical stenosis, vitamin D deficiency, anemia, and mixed hearing loss, who presents with exertional dyspnea and admitted for acute decompensated diastolic heart failure.  #Acute Hypoxic Respiratory Failure #Possible Unstable Angina #HFpEF (EF > 55% in 02/2021) #OSA Presented with dyspnea on exertion and "rattling" sensation of his chest when breathing. Also has bilateral LE edema. BNP 770 with CXR findings of signs of volume overload. Troponins elevated but  flat. No ischemic changes on EKG. Chest pressure only with deep breaths. Received IV lasix 80 mg in ED with adequate urine output of at least 1.2 L yesterday. This morning, feeling improved with no chest pressure or SOB. Will repeat echo to assess for any changes since 2022 echo. Will continue IV diuresis given good urine output while monitoring renal function and electrolyte derangements.  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 Has not started HD yet. Creatinine steady after IV diuresis, 4.37 with GFR 14. Follows with outpatient nephrology at the [facility]. Still making urine and has been having good output with diuresis. Will monitor for electrolytes derangements.  -trend BMP -continue outpatient nephrology f/u  #Primary Hyperaldosteronism #Adrenal Hyperplasia #HTN Adrenal hyperplasia diagnosed in 1997 and follows with endocrinology. He is on amlodipine 10 mg daily, lisinopril 40 mg daily and eplerenone 25 mg daily. Holding home lisinopril and eplerenone given poor renal function.  -hold lisinopril and eplerenone -continue home amlodipine  #Microcytic Anemia Initial Hgb 10.6. Patient on ferrous sulfate 325 mg MWF. Iron panel showed iron 44, TIBC 245, saturation 18% and ferritin 290. Hemoglobin stable at 10.9 today. Will continue his home supplement.  -continue home ferrous sulfate MWF -trend CBC  #T2DM with CKD A1c 5.4% in August. Does not appear to be on any medications for his diabetes. Has hx of neuropathy on gabapentin 100 mg BID at home. Dose is below maximum renally dosed gabapentin. -restart home gabapentin 100 mg BID  Other Chronic Conditions #HLD- Rosuvastatin 10 mg daily renally dosed, ASA 81 mg daily #GERD- Protonix 40 mg #PTSD/Insomnia- Escitalopram 10 mg daily, clonidine 0.3 mg TID,  melatonin qhs; hold prazosin given also on tamsulosin #DDD & Cervical stenosis- no current complaints; tylenol 650 mg q6h PRN  #Hx of gout- no acute flares; hold allopurinol for now; given current renal function consider allopurinol 50 mg twice weekly  #BPH- denies urinary symptoms; Finasteride 5 mg daily; hold tamsulosin for now  Diet: Heart Healthy IVF: None,None VTE: Heparin Code: Full PT/OT recs: Pending Family Update: wife at bedside  Dispo: Anticipated discharge to  TBD  pending diuresis and medical stability.  Angelique Blonder, DO Internal Medicine Resident PGY-1 Pager: [phone] Please contact the on call pager after 5 pm and on weekends at [phone].

## 2022-09-29 NOTE — ED Notes (Signed)
Pt asleep restong comfortably on stretcher NAD wife at bedside will continue to monitor

## 2022-09-29 NOTE — Discharge Planning (Signed)
Pt active at [facility] Provider: Maudie Mercury, MD SW: [place] phone: [phone] Please call [place] transfer coordinator for additional information [phone]

## 2022-09-29 NOTE — ED Notes (Signed)
Updated patient that he was admitted but is waiting on room assignment. Made aware that it could be sometime after shift change before room assigned. Patient expressed dissatisfaction in being held in ER, that room not warm enough. Attempted to comfort patient with additional warm blankets and pillows. Encouraged patient to change into hospital gown for comfort. Patient refused.

## 2022-09-29 NOTE — ED Notes (Signed)
Patient called out for assistance, this RN responded to patients need. Patient had pulled off condom catheter, reports that it was leaking on him, found canister to be full. Cleaned patient, changed patient linens, and placed new condom cath on patient. Patient agitated about incidence and stated to this RN "if you were doing your job this shit wouldn't have happened." Explained to patient that I have changed canister and apologized.

## 2022-09-29 NOTE — ED Notes (Signed)
Pharmacy at [place]

## 2022-09-29 NOTE — Plan of Care (Signed)
  Problem: Education: Goal: Knowledge of General Education information will improve Description: Including pain rating scale, medication(s)/side effects and non-pharmacologic comfort measures Outcome: Progressing   Problem: Safety: Goal: Ability to remain free from injury will improve Outcome: Progressing   

## 2022-09-29 NOTE — Progress Notes (Signed)
PT Cancellation Note  Patient Details Name: Karl Robinson MRN: [id number] DOB: March 25, 1951   Cancelled Treatment:    Reason Eval/Treat Not Completed: PT screened, no needs identified, will sign off - pt reports independence with mobility, per RN pt is mobilizing at baseline and has walked to/from bathroom. PT to sign off, please reconsult if needed.   Stacie Glaze, PT DPT Acute Rehabilitation Services Pager [phone]  Office [phone]    Louis Matte 09/29/2022, 3:12 PM

## 2022-09-29 NOTE — ED Notes (Signed)
Pt report received from previous nurse. Pt A&O x4, vitals stable, denies needs/complaints. Call bell in reach. No acute distress noted.  

## 2022-09-30 LAB — BASIC METABOLIC PANEL
Anion gap: 10 (ref 5–15)
BUN: 28 mg/dL — ABNORMAL HIGH (ref 8–23)
CO2: 24 mmol/L (ref 22–32)
Calcium: 8.3 mg/dL — ABNORMAL LOW (ref 8.9–10.3)
Chloride: 104 mmol/L (ref 98–111)
Creatinine, Ser: 4.3 mg/dL — ABNORMAL HIGH (ref 0.61–1.24)
GFR, Estimated: 14 mL/min — ABNORMAL LOW (ref >60)
Glucose, Bld: 136 mg/dL — ABNORMAL HIGH (ref 70–99)
Potassium: 3.2 mmol/L — ABNORMAL LOW (ref 3.5–5.1)
Sodium: 138 mmol/L (ref 135–145)

## 2022-09-30 LAB — MAGNESIUM: Magnesium: 2 mg/dL (ref 1.7–2.4)

## 2022-09-30 MED ORDER — POTASSIUM CHLORIDE CRYS ER 20 MEQ PO TBCR
20.0000 meq | EXTENDED_RELEASE_TABLET | Freq: Two times a day (BID) | ORAL | Status: AC
  Administered 2022-09-30 (×2): 20 meq via ORAL
  Filled 2022-09-30 (×2): qty 1

## 2022-09-30 MED ORDER — ALLOPURINOL 300 MG PO TABS
300.0000 mg | ORAL_TABLET | Freq: Every day | ORAL | Status: DC
  Administered 2022-09-30 – 2022-10-01 (×2): 300 mg via ORAL
  Filled 2022-09-30 (×2): qty 1

## 2022-09-30 MED ORDER — PRAZOSIN HCL 2 MG PO CAPS
4.0000 mg | ORAL_CAPSULE | Freq: Every day | ORAL | Status: DC
  Administered 2022-09-30: 4 mg via ORAL
  Filled 2022-09-30 (×2): qty 2

## 2022-09-30 MED ORDER — SODIUM CHLORIDE 0.9 % IV SOLN
250.0000 mg | Freq: Every day | INTRAVENOUS | Status: AC
  Administered 2022-09-30 – 2022-10-01 (×2): 250 mg via INTRAVENOUS
  Filled 2022-09-30 (×3): qty 20

## 2022-09-30 MED ORDER — FUROSEMIDE 10 MG/ML IJ SOLN
60.0000 mg | Freq: Every day | INTRAMUSCULAR | Status: DC
  Administered 2022-10-01: 60 mg via INTRAVENOUS
  Filled 2022-09-30: qty 6

## 2022-09-30 MED ORDER — FUROSEMIDE 10 MG/ML IJ SOLN
60.0000 mg | Freq: Once | INTRAMUSCULAR | Status: AC
  Administered 2022-09-30: 60 mg via INTRAVENOUS
  Filled 2022-09-30: qty 6

## 2022-09-30 NOTE — Progress Notes (Addendum)
HD#1 Subjective:   Summary: Karl Robinson is a 72 y.o. male with PMH of  HFpEF (EF > 55% in 02/2021), HTN, HLD, T2DM, CKD5, OSA (occasionally on CPAP, has not used in 2-3 months, causes nightmares with PTSD), GERD, BPH, DDD, PTSD, insomnia, adrenal hyperplasia, hyperaldosteronism, obesity, diverticulosis, gout, cervical stenosis, vitamin D deficiency, anemia, and mixed hearing loss  who presents with exertional dyspnea and admitted for acute decompensated diastolic heart failure.  Overnight Events: 2300 ml urine output   Patient states feeling well. No SOB and leg edema improved. Ambulating well without assistance. States he has not seen his nephrologist for several months but has appointment later this month. He has been on lisinopril still but held during this admission.    Objective:  Vital signs in last 24 hours: Vitals:   09/30/22 0009 09/30/22 0746 09/30/22 0838 09/30/22 0926  BP: (!) 173/75 (!) 161/64  (!) 161/64  Pulse: 62 64    Resp: 19 18    Temp: 98.7 F (37.1 C) 98.7 F (37.1 C)    TempSrc: Oral Oral    SpO2: 95% 95%    Weight: 87.2 kg  87.2 kg   Height:       Supplemental O2: Room Air SpO2: 95 %   Physical Exam:  Constitutional: awake, sitting up in bed comfortably, in no acute distress HENT: normocephalic atraumatic Cardiovascular: regular rate Pulmonary/Chest: normal work of breathing on room air MSK: trace to 1+ pitting LE edema bilaterally Skin: warm and dry, RUE AV fistula is clean, dry and intact with palpable thrill  Psych: pleasant mood  Filed Weights   09/29/22 2035 09/30/22 0009 09/30/22 0838  Weight: 87.2 kg 87.2 kg 87.2 kg     Intake/Output Summary (Last 24 hours) at 09/30/2022 1054 Last data filed at 09/30/2022 0340 Gross per 24 hour  Intake --  Output 2300 ml  Net -2300 ml    Net IO Since Admission: -5,000 mL [09/30/22 1054]  Pertinent Labs:    Latest Ref Rng & Units 09/29/2022   12:49 AM 09/28/2022    3:45 PM 09/03/2018    7:24  AM  CBC  WBC 4.0 - 10.5 K/uL 5.5  4.8  6.5   Hemoglobin 13.0 - 17.0 g/dL 10.9  10.6  12.5   Hematocrit 39.0 - 52.0 % 31.7  31.0  38.6   Platelets 150 - 400 K/uL 201  191  168        Latest Ref Rng & Units 09/30/2022   12:26 AM 09/29/2022   12:49 AM 09/28/2022    3:45 PM  CMP  Glucose 70 - 99 mg/dL 136  112  119   BUN 8 - 23 mg/dL '28  30  31   '$ Creatinine 0.61 - 1.24 mg/dL 4.30  4.37  4.33   Sodium 135 - 145 mmol/L 138  142  142   Potassium 3.5 - 5.1 mmol/L 3.2  3.5  4.0   Chloride 98 - 111 mmol/L 104  110  110   CO2 22 - 32 mmol/L '24  22  14   '$ Calcium 8.9 - 10.3 mg/dL 8.3  8.8  8.8     Imaging: ECHOCARDIOGRAM COMPLETE  Result Date: 09/29/2022    ECHOCARDIOGRAM REPORT   Patient Name:   Karl Robinson Date of Exam: 09/29/2022 Medical Rec #:  [id number]      Height:       69.0 in Accession #:    [id number]     Weight:  201.0 lb Date of Birth:  April 17, 1951      BSA:          2.070 m Patient Age:    71 years       BP:           160/64 mmHg Patient Gender: M              HR:           65 bpm. Exam Location:  Inpatient Procedure: 2D Echo, Color Doppler and Cardiac Doppler Indications:    CHF  History:        Patient has no prior history of Echocardiogram examinations.                 CHF; Risk Factors:Diabetes and Dyslipidemia.  Sonographer:    Meagan Baucom RDCS, FE, PE Referring Phys: [place]  1. Left ventricular ejection fraction, by estimation, is 60 to 65%. The left ventricle has normal function. The left ventricle has no regional wall motion abnormalities. There is moderate asymmetric left ventricular hypertrophy of the inferior segment. Left ventricular diastolic parameters are consistent with Grade II diastolic dysfunction (pseudonormalization).  2. Right ventricular systolic function is normal. The right ventricular size is normal.  3. Left atrial size was mildly dilated.  4. A small pericardial effusion is present. The pericardial effusion is circumferential.  5.  The mitral valve is normal in structure. Mild to moderate mitral valve regurgitation. No evidence of mitral stenosis.  6. The aortic valve is tricuspid. Aortic valve regurgitation is not visualized.  7. The inferior vena cava is normal in size with greater than 50% respiratory variability, suggesting right atrial pressure of 3 mmHg. Comparison(s): Unable to view prior. FINDINGS  Left Ventricle: In the setting of elevated posterior thickness, pericardial effusion, and E/e' values, outpatient pyrophospate imaging may be considered. Left ventricular ejection fraction, by estimation, is 60 to 65%. The left ventricle has normal function. The left ventricle has no regional wall motion abnormalities. The left ventricular internal cavity size was normal in size. There is moderate asymmetric left ventricular hypertrophy of the inferior segment. Left ventricular diastolic parameters  are consistent with Grade II diastolic dysfunction (pseudonormalization). Right Ventricle: The right ventricular size is normal. No increase in right ventricular wall thickness. Right ventricular systolic function is normal. Left Atrium: Left atrial size was mildly dilated. Right Atrium: Right atrial size was normal in size. Pericardium: A small pericardial effusion is present. The pericardial effusion is circumferential. Mitral Valve: The mitral valve is normal in structure. Mild to moderate mitral valve regurgitation. No evidence of mitral valve stenosis. Tricuspid Valve: The tricuspid valve is normal in structure. Tricuspid valve regurgitation is mild . No evidence of tricuspid stenosis. Aortic Valve: The aortic valve is tricuspid. Aortic valve regurgitation is not visualized. Aortic valve mean gradient measures 6.0 mmHg. Aortic valve peak gradient measures 12.5 mmHg. Aortic valve area, by VTI measures 1.83 cm. Pulmonic Valve: The pulmonic valve was normal in structure. Pulmonic valve regurgitation is not visualized. No evidence of pulmonic  stenosis. Aorta: The aortic root and ascending aorta are structurally normal, with no evidence of dilitation. Venous: The inferior vena cava is normal in size with greater than 50% respiratory variability, suggesting right atrial pressure of 3 mmHg. IAS/Shunts: No atrial level shunt detected by color flow Doppler.  LEFT VENTRICLE PLAX 2D LVIDd:         5.40 cm   Diastology LVIDs:  3.50 cm   LV e' medial:    6.37 cm/s LV PW:         1.20 cm   LV E/e' medial:  14.2 LV IVS:        1.00 cm   LV e' lateral:   8.39 cm/s LVOT diam:     1.80 cm   LV E/e' lateral: 10.8 LV SV:         69 LV SV Index:   34 LVOT Area:     2.54 cm  RIGHT VENTRICLE RV S prime:     13.80 cm/s TAPSE (M-mode): 1.9 cm LEFT ATRIUM             Index        RIGHT ATRIUM           Index LA diam:        4.50 cm 2.17 cm/m   RA Area:     22.30 cm LA Vol (A2C):   73.1 ml 35.31 ml/m  RA Volume:   63.40 ml  30.62 ml/m LA Vol (A4C):   84.7 ml 40.91 ml/m LA Biplane Vol: 81.9 ml 39.56 ml/m  AORTIC VALVE AV Area (Vmax):    1.93 cm AV Area (Vmean):   1.83 cm AV Area (VTI):     1.83 cm AV Vmax:           177.00 cm/s AV Vmean:          115.000 cm/s AV VTI:            0.380 m AV Peak Grad:      12.5 mmHg AV Mean Grad:      6.0 mmHg LVOT Vmax:         134.00 cm/s LVOT Vmean:        82.700 cm/s LVOT VTI:          0.273 m LVOT/AV VTI ratio: 0.72  AORTA Ao Root diam: 2.70 cm Ao Asc diam:  3.20 cm MITRAL VALVE               TRICUSPID VALVE MV Area (PHT): 3.60 cm    TR Peak grad:   29.4 mmHg MV Decel Time: 211 msec    TR Vmax:        271.00 cm/s MV E velocity: 90.70 cm/s MV A velocity: 77.10 cm/s  SHUNTS MV E/A ratio:  1.18        Systemic VTI:  0.27 m                            Systemic Diam: 1.80 cm Rudean Haskell MD Electronically signed by Rudean Haskell MD Signature Date/Time: 09/29/2022/1:47:54 PM    Final     Assessment/Plan:   Principal Problem:   Acute hypoxic respiratory failure (HCC) Active Problems:   Type 2 diabetes mellitus  with chronic kidney disease (HCC)   Acute exacerbation of CHF (congestive heart failure) ([place])   Patient Summary: Karl Robinson is a 72 y.o. with a pertinent PMH of HFpEF (EF > 55% in 02/2021), HTN, HLD, T2DM, CKD5, OSA (occasionally on CPAP, has not used in 2-3 months, causes nightmares with PTSD), GERD, BPH, DDD, PTSD, insomnia, adrenal hyperplasia, hyperaldosteronism, obesity, diverticulosis, gout, cervical stenosis, vitamin D deficiency, anemia, and mixed hearing loss, who presents with exertional dyspnea and admitted for acute decompensated diastolic heart failure.  #Acute Hypoxic Respiratory Failure 2/2 acute on chronic HFpEF (EF 60-65%) #OSA Presented with dyspnea  on exertion and "rattling" sensation of his chest when breathing along with bilateral LE edema. BNP 770 with CXR findings of signs of volume overload. Troponins elevated but flat. No ischemic changes on EKG. Received IV lasix 60 mg with 2300 ml output. Patient feeling improved with no chest pain or dyspnea, remains on room air. Repeat echo showed normal EF 60-65% with asymmetric hypertrophy, G2DD and small pericardial effusion. Changes likely in setting of CHF and HTN. Will continue diuresis today, replete electrolytes.  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. Patient has RUE AVF. Has not started HD yet. Creatinine steady after IV diuresis, 4.30 with GFR 14.  Still making urine and has been having good output with diuresis. Will monitor for electrolytes derangements. Patient may benefit with inpatient nephrology consult for recommendations, patient is on lisinopril at home which was held and if any recs on appropriate diuretic at discharge.   -trend BMP -consult nephrology here for recs and if patient able to resume lisinopril given progression  of CKD and recs for diuretic at discharge  -patient to continue outpatient nephrology f/u  #Primary Hyperaldosteronism #Adrenal Hyperplasia #HTN Adrenal hyperplasia diagnosed in 1997 and follows with endocrinology. He is on amlodipine 10 mg daily, lisinopril 40 mg daily and eplerenone 25 mg daily. Holding home lisinopril and eplerenone given poor renal function.  -hold lisinopril and eplerenone -continue home amlodipine  #Microcytic Anemia Initial Hgb 10.6. Patient on ferrous sulfate 325 mg MWF. Iron panel showed iron 44, TIBC 245, saturation 18% and ferritin 290. Hemoglobin stable at 10.9 today. Will continue his home supplement.  -continue home ferrous sulfate MWF -trend CBC  #T2DM with CKD A1c 5.4% in August. Does not appear to be on any medications for his diabetes. Has hx of neuropathy on gabapentin 100 mg BID at home. Dose is below maximum renally dosed gabapentin. -restart home gabapentin 100 mg BID  #Hypokalemia Potassium 3.2 today after diuresis. Magnesium stable 2.0. Will replete with po potassium.  -repeat BMP  Other Chronic Conditions #HLD- Rosuvastatin 10 mg daily renally dosed, ASA 81 mg daily #GERD- Protonix 40 mg #PTSD/Insomnia- resume Prazosin 4 mg daily; Escitalopram 10 mg daily, clonidine 0.3 mg TID, melatonin qhs #DDD & Cervical stenosis- no current complaints; tylenol 650 mg q6h PRN  #Hx of gout- no acute flares; continue home allopurinol 300 mg daily  #BPH- denies urinary symptoms; Finasteride 5 mg daily; hold tamsulosin for now  Diet: Heart Healthy IVF: None,None VTE: Heparin Code: Full PT/OT recs: None Family Update: wife at bedside  Dispo: Anticipated discharge to  TBD  pending diuresis and medical stability.  Angelique Blonder, DO Internal Medicine Resident PGY-1 Pager: [phone] Please contact the on call pager after 5 pm and on weekends at [phone].

## 2022-09-30 NOTE — Consult Note (Signed)
[facility] Renal Consultation Note  Requesting MD: Heber Steinauer Indication for Consultation: Medication regimen in now CKD stage 5  HPI:  Karl Robinson is a 72 y.o. male with pertinent PMH of CKD stage 4, HFpEF, OSA with inconsistent CPAP use, T2DM, HLD who presented with progressive dyspnea noted on admission to have acute hypoxic respiratory failure 2/2 hypervolemia, thought to be due to HFpEF and advanced CKD. He follows outpatient with nephrology at the [facility].  Initial labs significant for BUN/Cr 31/4.33, Hgb 10.6, BNP 770. Per chart review, recent labs checked 08/2022 showed Cr ~4.0 and GFR 15.  Since admission he has had improvement in oxygen requirement and peripheral edema.Primary team has been diuresing, he has received a total of 2g IV lasix since admission with adequate UOP. UOP over last 24h of 2.3L. Total measured volume status at this point -5L though no input amount recorded so unclear if this is accurate. His weight is decreased 4 kg since admission. Subjectively, he is improved as well.  Per he and his wife, his GFR had been stable at 20 for a long time but in January was noted to drop to 15, which was what prompted his nephrologist to proceed with reassessment of his RUE AVF that was placed 04/2022. Preceding admission, he does report decreased appetite and low energy that have gradually progressed over the last few months. At this time, his daughter who has been identified as a transplant match is working towards a healthy BMI so that she will be able to proceed with the transplant surgery.   Creatinine, Ser  Date/Time Value Ref Range Status  09/30/2022 12:26 AM 4.30 (H) 0.61 - 1.24 mg/dL Final  09/29/2022 12:49 AM 4.37 (H) 0.61 - 1.24 mg/dL Final  09/28/2022 03:45 PM 4.33 (H) 0.61 - 1.24 mg/dL Final  09/03/2018 07:24 AM 2.03 (H) 0.61 - 1.24 mg/dL Final  06/19/2013 03:58 PM 1.8 (H) 0.4 - 1.5 mg/dL Final  08/25/2010 12:00 AM 1.53 mg/dL   07/11/2009 08:09 PM 1.52 (H)  0.40 - 1.50 mg/dL Final  01/16/2009 04:02 PM 1.4 0.4 - 1.5 mg/dL Final  01/11/2009 12:26 PM 1.4 0.4 - 1.5 mg/dL Final  07/09/2008 09:07 AM 1.6 (H) 0.4 - 1.5 mg/dL Final    PMHx: Past Medical History:  Diagnosis Date   Adrenal hyperplasia ([place])    Anemia, unspecified    Arthropathy, unspecified, site unspecified    Benign hypertension, endocrine    Cervical stenosis of spine    Changes in vascular appearance of retina    CHF (congestive heart failure) (HCC)    CKD (chronic kidney disease) stage 3, GFR 30-59 ml/min ([place])    followed by [facility] (Dr. Joseph Berkshire)   Diverticulosis of colon (without mention of hemorrhage)    Esophageal reflux    Gout    HLD (hyperlipidemia)    Hyperaldosteronism, unspecified ([place])    Hypertrophy of prostate without urinary obstruction and other lower urinary tract symptoms (LUTS)    Mixed hearing loss, unspecified    Obesity    Ruptured lumbar disc    L5-S1   Type II or unspecified type diabetes mellitus without mention of complication, not stated as uncontrolled    Unspecified vitamin D deficiency    Varicocele    Left   Past Surgical History:  Procedure Laterality Date   Bone Scan  11/16/07   Normal   CARDIAC CATHETERIZATION  12/14/00   Dr. Gena Fray   CARDIAC CATHETERIZATION  1989   Dr. Jacelyn Pi   CARDIOVASCULAR STRESS  TEST  10/21/05   WNL, but had EKG changes   Cervical MRI  04/16/03   Stenosis of C4/5,C5/6 with Narrowing   EMG/NCS  05/16/03   RVL Normal, Borderline RCTS   ESOPHAGOGASTRODUODENOSCOPY  11/98   Gastritis; Duod ulcer   ESOPHAGOGASTRODUODENOSCOPY  05/06/01   Dr. Kelton Pillar Mucosa Bx-Negative   FLEXIBLE SIGMOIDOSCOPY  07/18/98   Dr. Bernell List   Lumbosacral MRI  11/16/07   L5-S1 Disc Rupture   NECK SURGERY  10/26/03   Dr. Louanne Skye   Pelvic MRI  11/16/07   Normal   Renal Arterial US  08/10/03   Negative   ROTATOR CUFF REPAIR Right 01/17/04   Testicular US  09/07/00   Left-sided varicocele (small)   TYMPANIC  MEMBRANE REPAIR Right 1972   Prosthesis   US ECHOCARDIOGRAPHY  12/07/00   EF 70 %   Family Hx:  Family History  Problem Relation Age of Onset   Cirrhosis Father        ETOH   Hypertension Mother        Kidney transplant   Congestive Heart Failure Mother    Hypertension Brother    Drug abuse Brother    Stroke Maternal Grandmother    Social History:  reports that he has never smoked. He has never used smokeless tobacco. He reports that he does not drink alcohol and does not use drugs.  Allergies:  Allergies  Allergen Reactions   Hydromorphone Hcl     REACTION: UNSPECIFIED   Oxycodone Hcl     REACTION: itch   Medications: Prior to Admission medications   Medication Sig Start Date End Date Taking? Authorizing Provider  acetaminophen (TYLENOL) 500 MG tablet Take 500 mg by mouth 2 (two) times daily.   Yes [provider]  allopurinol (ZYLOPRIM) 300 MG tablet Take 300 mg by mouth daily.   Yes [provider]  amLODipine (NORVASC) 10 MG tablet Take 10 mg by mouth daily.   Yes [provider]  aspirin 81 MG tablet Take 81 mg by mouth daily.   Yes [provider]  Cholecalciferol (VITAMIN D3 PO) Take 2,000 Units by mouth daily.   Yes [provider]  cloNIDine (CATAPRES) 0.3 MG tablet Take 0.3 mg by mouth 3 (three) times daily.   Yes [provider]  dutasteride (AVODART) 0.5 MG capsule Take 0.5 mg by mouth daily.   Yes [provider]  ferrous sulfate 325 (65 FE) MG tablet Take 325 mg by mouth every Monday, Wednesday, and Friday.   Yes [provider]  furosemide (LASIX) 40 MG tablet Take 40 mg by mouth every other day.   Yes [provider]  lisinopril (ZESTRIL) 40 MG tablet Take 40 mg by mouth at bedtime.   Yes [provider]  methocarbamol (ROBAXIN) 500 MG tablet Take 1 tablet (500 mg total) by mouth 2 (two) times daily. 09/03/18  Yes Curatolo, Adam, DO  Multiple Vitamin (MULTIVITAMIN PO) Take  1 tablet by mouth in the morning.   Yes [provider]  pantoprazole (PROTONIX) 40 MG tablet Take 40 mg by mouth 2 (two) times daily.   Yes [provider]  potassium chloride SA (KLOR-CON M) 20 MEQ tablet Take 20 mEq by mouth 2 (two) times daily.   Yes [provider]  prazosin (MINIPRESS) 2 MG capsule Take 4 mg by mouth at bedtime.   Yes [provider]  rosuvastatin (CRESTOR) 40 MG tablet Take 40 mg by mouth at bedtime.   Yes  [provider]  tamsulosin (FLOMAX) 0.4 MG CAPS capsule Take 0.8 mg by mouth at bedtime.   Yes [provider]    I have reviewed the patient's current medications.  Labs:  Results for orders placed or performed during the hospital encounter of 09/28/22 (from the past 48 hour(s))  Basic metabolic panel     Status: Abnormal   Collection Time: 09/28/22  3:45 PM  Result Value Ref Range   Sodium 142 135 - 145 mmol/L   Potassium 4.0 3.5 - 5.1 mmol/L   Chloride 110 98 - 111 mmol/L   CO2 14 (L) 22 - 32 mmol/L   Glucose, Bld 119 (H) 70 - 99 mg/dL    Comment: Glucose reference range applies only to samples taken after fasting for at least 8 hours.   BUN 31 (H) 8 - 23 mg/dL   Creatinine, Ser 4.33 (H) 0.61 - 1.24 mg/dL   Calcium 8.8 (L) 8.9 - 10.3 mg/dL   GFR, Estimated 14 (L) >60 mL/min    Comment: (NOTE) Calculated using the CKD-EPI Creatinine Equation (2021)    Anion gap 18 (H) 5 - 15    Comment: Performed at [facility] [address], [place], [place] [address]  CBC with Differential     Status: Abnormal   Collection Time: 09/28/22  3:45 PM  Result Value Ref Range   WBC 4.8 4.0 - 10.5 K/uL   RBC 3.99 (L) 4.22 - 5.81 MIL/uL   Hemoglobin 10.6 (L) 13.0 - 17.0 g/dL   HCT 31.0 (L) 39.0 - 52.0 %   MCV 77.7 (L) 80.0 - 100.0 fL   MCH 26.6 26.0 - 34.0 pg   MCHC 34.2 30.0 - 36.0 g/dL   RDW 14.8 11.5 - 15.5 %   Platelets 191 150 - 400 K/uL   nRBC 0.0 0.0 - 0.2 %   Neutrophils Relative % 75 %   Neutro  Abs 3.6 1.7 - 7.7 K/uL   Lymphocytes Relative 18 %   Lymphs Abs 0.9 0.7 - 4.0 K/uL   Monocytes Relative 5 %   Monocytes Absolute 0.2 0.1 - 1.0 K/uL   Eosinophils Relative 2 %   Eosinophils Absolute 0.1 0.0 - 0.5 K/uL   Basophils Relative 0 %   Basophils Absolute 0.0 0.0 - 0.1 K/uL   Immature Granulocytes 0 %   Abs Immature Granulocytes 0.01 0.00 - 0.07 K/uL    Comment: Performed at [facility] [address], [place], [place] [address]  Brain natriuretic peptide     Status: Abnormal   Collection Time: 09/28/22  3:45 PM  Result Value Ref Range   B Natriuretic Peptide 770.3 (H) 0.0 - 100.0 pg/mL    Comment: Performed at [facility] [address], [place], [place] [address]  Resp panel by RT-PCR (RSV, Flu A&B, Covid) Anterior Nasal Swab     Status: None   Collection Time: 09/28/22 10:06 PM   Specimen: Anterior Nasal Swab  Result Value Ref Range   SARS Coronavirus 2 by RT PCR NEGATIVE NEGATIVE   Influenza A by PCR NEGATIVE NEGATIVE   Influenza B by PCR NEGATIVE NEGATIVE    Comment: (NOTE) The Xpert Xpress SARS-CoV-2/FLU/RSV plus assay is intended as an aid in the diagnosis of influenza from Nasopharyngeal swab specimens and should not be used as a sole basis for treatment. Nasal washings and aspirates are unacceptable for Xpert Xpress SARS-CoV-2/FLU/RSV testing.  Fact Sheet for Patients: EntrepreneurPulse.com.au  Fact Sheet for  Healthcare Providers: IncredibleEmployment.be  This test is not yet approved or cleared by the [place] and has been authorized for detection and/or diagnosis of SARS-CoV-2 by FDA under an Emergency Use Authorization (EUA). This EUA will remain in effect (meaning this test can be used) for the duration of the COVID-19 declaration under Section 564(b)(1) of the Act, 21 U.S.C. section 360bbb-3(b)(1), unless the authorization is terminated or revoked.     Resp Syncytial Virus by PCR NEGATIVE  NEGATIVE    Comment: (NOTE) Fact Sheet for Patients: EntrepreneurPulse.com.au  Fact Sheet for Healthcare Providers: IncredibleEmployment.be  This test is not yet approved or cleared by the [place] FDA and has been authorized for detection and/or diagnosis of SARS-CoV-2 by FDA under an Emergency Use Authorization (EUA). This EUA will remain in effect (meaning this test can be used) for the duration of the COVID-19 declaration under Section 564(b)(1) of the Act, 21 U.S.C. section 360bbb-3(b)(1), unless the authorization is terminated or revoked.  Performed at [facility], [place] [address], [place], [place] [address]   Troponin I (High Sensitivity)     Status: Abnormal   Collection Time: 09/28/22 11:00 PM  Result Value Ref Range   Troponin I (High Sensitivity) 27 (H) <18 ng/L    Comment: (NOTE) Elevated high sensitivity troponin I (hsTnI) values and significant  changes across serial measurements may suggest ACS but many other  chronic and acute conditions are known to elevate hsTnI results.  Refer to the "Links" section for chest pain algorithms and additional  guidance. Performed at [facility], [place] [address] [place], [place] [address]   Iron and TIBC     Status: Abnormal   Collection Time: 09/28/22 11:00 PM  Result Value Ref Range   Iron 44 (L) 45 - 182 ug/dL   TIBC 245 (L) 250 - 450 ug/dL   Saturation Ratios 18 17.9 - 39.5 %   UIBC 201 ug/dL    Comment: Performed at [facility] [address] [place], [place] [address]  Ferritin     Status: None   Collection Time: 09/28/22 11:00 PM  Result Value Ref Range   Ferritin 290 24 - 336 ng/mL    Comment: Performed at [facility] [address] [place], [place] [address]  Troponin I (High Sensitivity)     Status: Abnormal   Collection Time: 09/29/22 12:49 AM  Result Value Ref Range   Troponin I (High Sensitivity) 36 (H) <18 ng/L    Comment:  (NOTE) Elevated high sensitivity troponin I (hsTnI) values and significant  changes across serial measurements may suggest ACS but many other  chronic and acute conditions are known to elevate hsTnI results.  Refer to the "Links" section for chest pain algorithms and additional  guidance. Performed at [facility], [place] [address] [place], [place] [id number]   Basic metabolic panel     Status: Abnormal   Collection Time: 09/29/22 12:49 AM  Result Value Ref Range   Sodium 142 135 - 145 mmol/L   Potassium 3.5 3.5 - 5.1 mmol/L   Chloride 110 98 - 111 mmol/L   CO2 22 22 - 32 mmol/L   Glucose, Bld 112 (H) 70 - 99 mg/dL    Comment: Glucose reference range applies only to samples taken after fasting for at least 8 hours.   BUN 30 (H) 8 - 23 mg/dL   Creatinine, Ser 4.37 (H) 0.61 - 1.24 mg/dL   Calcium 8.8 (  L) 8.9 - 10.3 mg/dL   GFR, Estimated 14 (L) >60 mL/min    Comment: (NOTE) Calculated using the CKD-EPI Creatinine Equation (2021)    Anion gap 10 5 - 15    Comment: Performed at [facility], [place] [address], [place], [place] [address]  CBC     Status: Abnormal   Collection Time: 09/29/22 12:49 AM  Result Value Ref Range   WBC 5.5 4.0 - 10.5 K/uL   RBC 4.14 (L) 4.22 - 5.81 MIL/uL   Hemoglobin 10.9 (L) 13.0 - 17.0 g/dL   HCT 31.7 (L) 39.0 - 52.0 %   MCV 76.6 (L) 80.0 - 100.0 fL   MCH 26.3 26.0 - 34.0 pg   MCHC 34.4 30.0 - 36.0 g/dL   RDW 14.8 11.5 - 15.5 %   Platelets 201 150 - 400 K/uL   nRBC 0.0 0.0 - 0.2 %    Comment: Performed at [facility], [place] [address], [place], [place] [address]  Magnesium     Status: None   Collection Time: 09/29/22 12:49 AM  Result Value Ref Range   Magnesium 1.8 1.7 - 2.4 mg/dL    Comment: Performed at [facility] [address], [place], [place] [address]  Troponin I (High Sensitivity)     Status: Abnormal   Collection Time: 09/29/22 12:49 AM  Result Value Ref Range   Troponin I (High Sensitivity) 34 (H) <18 ng/L     Comment: (NOTE) Elevated high sensitivity troponin I (hsTnI) values and significant  changes across serial measurements may suggest ACS but many other  chronic and acute conditions are known to elevate hsTnI results.  Refer to the "Links" section for chest pain algorithms and additional  guidance. Performed at [facility], [place] [address] [place], [place] [id number]   Basic metabolic panel     Status: Abnormal   Collection Time: 09/30/22 12:26 AM  Result Value Ref Range   Sodium 138 135 - 145 mmol/L   Potassium 3.2 (L) 3.5 - 5.1 mmol/L   Chloride 104 98 - 111 mmol/L   CO2 24 22 - 32 mmol/L   Glucose, Bld 136 (H) 70 - 99 mg/dL    Comment: Glucose reference range applies only to samples taken after fasting for at least 8 hours.   BUN 28 (H) 8 - 23 mg/dL   Creatinine, Ser 4.30 (H) 0.61 - 1.24 mg/dL   Calcium 8.3 (L) 8.9 - 10.3 mg/dL   GFR, Estimated 14 (L) >60 mL/min    Comment: (NOTE) Calculated using the CKD-EPI Creatinine Equation (2021)    Anion gap 10 5 - 15    Comment: Performed at [facility] [address] [place], [place] [address]  Magnesium     Status: None   Collection Time: 09/30/22 12:26 AM  Result Value Ref Range   Magnesium 2.0 1.7 - 2.4 mg/dL    Comment: Performed at [facility] [address], [place], [place] [address]   ROS: Pertinent items are noted in HPI.  Physical Exam: Vitals:   09/30/22 0926 09/30/22 1224  BP: (!) 161/64 (!) 167/62  Pulse:  69  Resp:  18  Temp:  98.3 F (36.8 C)  SpO2:       Constitutional:Resting comfortably with slight head incline, NAD.  Cardio:Regular rate and rhythm. +JVP to 3 cm above clavicle. 1+ pitting edema of bilateral LE to knees. RUE AVF with bruit. Pulm:Normal work of breathing  on room air. Abdomen: Soft, nontender, nondistended. Skin:Warm and dry. Neuro:Alert and oriented x3. No focal deficit noted. Psych:Pleasant mood and affect.  Assessment/Plan:  HTN OSA Patient intermittently  uses CPAP at home, has additional history of HTN managed outpatient with amlodipine 10 mg daily, lisinopril 40 mg daily, eplerenone 25 mg daily. Currently lisinopril and eplerenone are on hold due to concerns of further decline of renal function and BP is above goal. Plan: -Continue home amlodipine 10 mg daily -Would not recommend restarting lisinopril or eplerenone; will need to follow up outpatient for medication regimen adjustment to treat HFpEF and HTN without risking further decline of renal function -CPAP nightly   CKD stage 4 versus progression to stage 5 HFpEF exacerbation Renal function worse than most recent values obtained from outside facility, apparently serum creatinine then of around 4.0 with GFR 15; here, serum creatinine has ranged around 4.3 with GFR 14. He has been significantly volume overloaded in setting of HFpEF exacerbation with good diuresis thus far.  He likely has a mixed picture as far as further decline in renal function. He was noted at [place] earlier this year to have had a drop in his previously stable GFR from 20 to 15. Additionally he has been prepared for HD by his nephrologist and has a maturing RUE AVF. I do still have concern that his HFpEF exacerbation and volume overload are maintaining some level of further renal dysfunction. He was not on higher dose diuretics outpatient until immediately before this admission, when his dose was increased to lasix 40 mg PO three times weekly for volume overload.   I recommend continued diuresis and monitoring of renal function. I am hopeful that continued diuresis and optimization of his volume status will further improve his BP as well. Ongoing preparation for renal transplant per [facility] nephrologist. His next appointment with them is at the end of this month. Plan: -Continue IV lasix 60 mg daily; transition to lasix 40 mg PO daily or BID at discharge with titration of dose based on clinical assessment -Replete electrolytes as  needed -Strict I's/O;s -Daily weights -Avoid nephrotoxic agents, renally dose medications -Trend renal function   Anemia of chronic kidney disease History of iron deficiency History of microcytic anemia, on ferrous sulfate 325 mg MWF. CBC and iron studies consistent with anemia of chronic disease, likely due to chronic kidney disease. Iron studies are also low. Plan: -Ferrlecit 250 mg IV daily x2 -Continue ferrous sulfate 325 mg MWF -Trend CBC  HLD Current dose of rosuvastatin 20 mg daily exceeds recommended dosing with his current renal function. Recommend either decreasing to 10 mg daily or changing to alternative statin such as atorvastatin for high-intensity needs.    Additional hospital problems managed by primary team: Acute Hypoxic Respiratory Failure Possible Stable Angina BPH T2DM PTSD Insomnia DDD Cervical stenosis Vitamin D Deficiency  GERD Gout Primary Hyperaldosteronism Adrenal Hyperplasia   Farrel Gordon, DO Internal Medicine PGY-2 09/30/2022, 12:59 PM

## 2022-10-01 ENCOUNTER — Other Ambulatory Visit (HOSPITAL_COMMUNITY): Payer: Self-pay

## 2022-10-01 DIAGNOSIS — J9601 Acute respiratory failure with hypoxia: Secondary | ICD-10-CM

## 2022-10-01 DIAGNOSIS — I509 Heart failure, unspecified: Secondary | ICD-10-CM

## 2022-10-01 DIAGNOSIS — N184 Chronic kidney disease, stage 4 (severe): Secondary | ICD-10-CM | POA: Diagnosis present

## 2022-10-01 LAB — RENAL FUNCTION PANEL
Albumin: 2.5 g/dL — ABNORMAL LOW (ref 3.5–5.0)
Anion gap: 8 (ref 5–15)
BUN: 32 mg/dL — ABNORMAL HIGH (ref 8–23)
CO2: 24 mmol/L (ref 22–32)
Calcium: 8.3 mg/dL — ABNORMAL LOW (ref 8.9–10.3)
Chloride: 106 mmol/L (ref 98–111)
Creatinine, Ser: 4.31 mg/dL — ABNORMAL HIGH (ref 0.61–1.24)
GFR, Estimated: 14 mL/min — ABNORMAL LOW (ref >60)
Glucose, Bld: 122 mg/dL — ABNORMAL HIGH (ref 70–99)
Phosphorus: 4.1 mg/dL (ref 2.5–4.6)
Potassium: 3.4 mmol/L — ABNORMAL LOW (ref 3.5–5.1)
Sodium: 138 mmol/L (ref 135–145)

## 2022-10-01 LAB — MAGNESIUM: Magnesium: 1.9 mg/dL (ref 1.7–2.4)

## 2022-10-01 MED ORDER — POTASSIUM CHLORIDE CRYS ER 20 MEQ PO TBCR
20.0000 meq | EXTENDED_RELEASE_TABLET | Freq: Two times a day (BID) | ORAL | Status: DC
  Administered 2022-10-01: 20 meq via ORAL
  Filled 2022-10-01: qty 1

## 2022-10-01 MED ORDER — FUROSEMIDE 40 MG PO TABS
40.0000 mg | ORAL_TABLET | Freq: Two times a day (BID) | ORAL | 0 refills | Status: DC
  Filled 2022-10-01: qty 60, 30d supply, fill #0

## 2022-10-01 MED ORDER — MAGNESIUM SULFATE 2 GM/50ML IV SOLN
2.0000 g | Freq: Once | INTRAVENOUS | Status: AC
  Administered 2022-10-01: 2 g via INTRAVENOUS
  Filled 2022-10-01: qty 50

## 2022-10-01 MED ORDER — ROSUVASTATIN CALCIUM 10 MG PO TABS
10.0000 mg | ORAL_TABLET | Freq: Every day | ORAL | 0 refills | Status: AC
  Filled 2022-10-01: qty 30, 30d supply, fill #0

## 2022-10-01 MED ORDER — PANTOPRAZOLE SODIUM 40 MG PO TBEC: 40.0000 mg | DELAYED_RELEASE_TABLET | Freq: Every day | ORAL | 0 refills | Status: AC

## 2022-10-01 MED ORDER — GABAPENTIN 100 MG PO CAPS: 100.0000 mg | ORAL_CAPSULE | Freq: Two times a day (BID) | ORAL | 0 refills | Status: DC

## 2022-10-01 NOTE — Discharge Instructions (Addendum)
You were hospitalized for shortness of breath, chest discomfort and leg swelling. We were able to remove the extra fluid off with Lasix. I am glad to hear you are feeling better. We made some changes to your medications, see below. You will start taking Lasix 40 mg twice a day. We reduced your rosuvastatin to 10 mg daily. Please make sure to follow up with your kidney doctor and primary care doctor. Thank you for allowing [place] to be part of your care.   Please make sure to make follow up appointments with your primary care doctor and kidney doctor regarding recent hospitalization.   Please note these changes made to your medications:  *Please START taking:  -Lasix (furosemide) 40 mg twice daily (You received IV lasix today 3/14, so you can start oral tomorrow 3/15) -Rosuvastatin decrease to 10 mg daily  *Please STOP taking:  -Lisinopril -Eplerenone -Tamsulosin   Please make sure to follow closely with your kidney doctor and primary care at the [place].

## 2022-10-01 NOTE — Plan of Care (Signed)

## 2022-10-01 NOTE — TOC Transition Note (Signed)
Transition of Care [facility]) - CM/SW Discharge Note   Patient Details  Name: Karl Robinson MRN: [id number] Date of Birth: 19-Mar-1951  Transition of Care [facility]) CM/SW Contact:  Zenon Mayo, RN Phone Number: 10/01/2022, 1:30 PM   Clinical Narrative:    Patient is for dc has no needs.         Patient Goals and CMS Choice      Discharge Placement                         Discharge Plan and Services Additional resources added to the After Visit Summary for                                       Social Determinants of Health (SDOH) Interventions SDOH Screenings   Food Insecurity: No Food Insecurity (09/29/2022)  Housing: Low Risk  (09/29/2022)  Transportation Needs: No Transportation Needs (09/29/2022)  Utilities: Not At Risk (09/29/2022)  Tobacco Use: Low Risk  (09/28/2022)     Readmission Risk Interventions     No data to display

## 2022-10-01 NOTE — TOC Progression Note (Addendum)
Transition of Care [facility]) - Progression Note    Patient Details  Name: Karl Robinson MRN: [id number] Date of Birth: 1951-07-10  [facility]) CM/SW Contact  Zenon Mayo, RN Phone Number: 10/01/2022, 11:39 AM  Clinical Narrative:    From home with spouse, CHF, conts on iv lasix, iv iron, IV mag, will transition to po iron. Pt goes to [facility] Provider: Maudie Mercury, MD SW: [place] phone: [phone] TOC following.         Expected Discharge Plan and Services                                               Social Determinants of Health (SDOH) Interventions SDOH Screenings   Food Insecurity: No Food Insecurity (09/29/2022)  Housing: Low Risk  (09/29/2022)  Transportation Needs: No Transportation Needs (09/29/2022)  Utilities: Not At Risk (09/29/2022)  Tobacco Use: Low Risk  (09/28/2022)    Readmission Risk Interventions     No data to display

## 2022-10-01 NOTE — Discharge Summary (Addendum)
Name: Karl Robinson MRN: [id number] DOB: [birth year] 72 y.o. PCP: [facility]  Date of Admission: 09/28/2022  3:00 PM Date of Discharge: 10/01/2022 Attending Physician: Dr. Angelia Mould  Discharge Diagnosis: Principal Problem:   Acute hypoxic respiratory failure ([place]) Active Problems:   Type 2 diabetes mellitus with chronic kidney disease ([place])   Acute exacerbation of CHF (congestive heart failure) (H[facility])   Chronic kidney disease, stage 4 (severe) ([place])    Discharge Medications: Allergies as of 10/01/2022       Reactions   Hydromorphone Hcl    REACTION: UNSPECIFIED   Oxycodone Hcl    REACTION: itch        Medication List     STOP taking these medications    lisinopril 40 MG tablet Commonly known as: ZESTRIL   methocarbamol 500 MG tablet Commonly known as: ROBAXIN   omeprazole 40 MG capsule Commonly known as: PRILOSEC Replaced by: pantoprazole 40 MG tablet   tamsulosin 0.4 MG Caps capsule Commonly known as: FLOMAX       TAKE these medications    acetaminophen 500 MG tablet Commonly known as: TYLENOL Take 500 mg by mouth 2 (two) times daily.   allopurinol 300 MG tablet Commonly known as: ZYLOPRIM Take 300 mg by mouth daily.   amLODipine 10 MG tablet Commonly known as: NORVASC Take 10 mg by mouth daily.   aspirin 81 MG tablet Take 81 mg by mouth daily.   cloNIDine 0.3 MG tablet Commonly known as: CATAPRES Take 0.3 mg by mouth 3 (three) times daily.   dutasteride 0.5 MG capsule Commonly known as: AVODART Take 0.5 mg by mouth daily.   ferrous sulfate 325 (65 FE) MG tablet Take 325 mg by mouth every Monday, Wednesday, and Friday.   furosemide 40 MG tablet Commonly known as: Lasix Take 1 tablet (40 mg total) by mouth 2 (two) times daily. What changed: when to take this   MULTIVITAMIN PO Take 1 tablet by mouth in the morning.   pantoprazole 40 MG tablet Commonly known as: PROTONIX Take 1 tablet (40 mg total) by mouth daily. Start  taking on: October 02, 2022 Replaces: omeprazole 40 MG capsule   potassium chloride SA 20 MEQ tablet Commonly known as: KLOR-CON M Take 20 mEq by mouth 2 (two) times daily.   prazosin 2 MG capsule Commonly known as: MINIPRESS Take 4 mg by mouth at bedtime.   rosuvastatin 10 MG tablet Commonly known as: CRESTOR Take 1 tablet (10 mg total) by mouth daily. Start taking on: October 02, 2022 What changed:  medication strength how much to take when to take this   VITAMIN D3 PO Take 2,000 Units by mouth daily.        Disposition and follow-up:   Karl Robinson was discharged from [facility] in Good condition.  At the hospital follow up visit please address:  1.  Follow-up:  a. HFpEF: assess volume status and any symptoms, check if taking Lasix 40 mg BID    b. CKD: ensure proper follow up with nephrology   c. Medications: stopped lisinopril, eplerenone and tamsulosin in setting of progression of renal disease  2.  Labs / imaging needed at time of follow-up: BMP  3.  Pending labs/ test needing follow-up: none  4.  Medication Changes  -Lasix (furosemide) 40 mg BID -Rosuvastatin decrease to 10 mg daily -STOP: lisinopril, eplerenone and tamsulosin  Follow-up Appointments:  [place]. Schedule an  appointment as soon as possible for a visit.   Specialty: General Practice Contact information: [place] [address] [phone]                 Hospital Course by problem list: Karl Robinson is a 72 y.o. with a pertinent PMH of HFpEF (EF > 55% in 02/2021), HTN, HLD, T2DM, CKD5, OSA (occasionally on CPAP, has not used in 2-3 months, causes nightmares with PTSD), GERD, BPH, DDD, PTSD, insomnia, adrenal hyperplasia, hyperaldosteronism, obesity, diverticulosis, gout, cervical stenosis, vitamin D deficiency, anemia, and mixed hearing loss, who presents with exertional dyspnea and admitted for acute on  chronic HFpEF.    #Acute Hypoxic Respiratory Failure 2/2 acute on chronic HFpEF (EF 60-65%) #OSA Presented with dyspnea on exertion and "rattling" sensation of his chest when breathing along with bilateral LE edema. BNP 770 with CXR findings of signs of volume overload. Troponins elevated but flat. No ischemic changes on EKG. Started IV lasix for diuresis with improvement of symptoms and volume status. Documented urine output of almost 5.7 L during hospital course. Repeat echo showed normal EF 60-65% with asymmetric hypertrophy, G2DD and small pericardial effusion. Will discharge with oral Lasix 40 mg BID.    CKD IV versus progression to V Follows with outpatient nephrology at the [facility]. Patient has RUE AVF. Has not started HD yet. Possible plans for renal transplant as well per patient and family. Creatinine has been stable around 4.30 with GFR 14.  Still making urine and has been having good output with diuresis. Consulted nephrology and they provided recommendations. Adjusted his home medications to be renally dosed.    #Primary Hyperaldosteronism #Adrenal Hyperplasia #HTN Adrenal hyperplasia diagnosed in 1997 and follows with endocrinology. He is on amlodipine 10 mg daily, lisinopril 40 mg daily and eplerenone 25 mg daily. We stopped lisinopril and eplerenone given progressing kidney disease. BP improved with diuresis and continued home amlodipine.    #Microcytic Anemia 2/2 Anemia of CKD Initial Hgb 10.6 and stable throughout hospital course. Patient on ferrous sulfate 325 mg MWF. Iron panel showed iron 44, TIBC 245, saturation 18% and ferritin 290. Patient received IV iron for 2 doses when in hospital.    #T2DM with CKD and neuropathy A1c 5.4% in August. Does not appear to be on any medications for his diabetes. Has hx of neuropathy and was reportedly on gabapentin 100 mg BID at home. Confirmed with patient and pharmacy that he is no longer taking gabapentin.    #Hypokalemia Potassium lowest  at 3.2. Magnesium 1.9- 2.0. Repletion of potassium and magnesium during hospital course.   #HLD- decreased rosuvastatin to 10 mg daily renally dosed, continue ASA 81 mg daily  #PTSD/Insomnia- Prazosin 4 mg daily; Escitalopram 10 mg daily, clonidine 0.3 mg TID, melatonin qhs  #Hx of gout- no acute flares; continue home allopurinol 300 mg daily   #BPH- denies urinary symptoms; Finasteride 5 mg daily; held tamsulosin given patient also on prazosin   Discharge Subjective: Patient feeling well this morning. No new concerns. No dyspnea or chest pressure. Leg swelling has improved. Patient is ready to go home with wife. Plans to have follow up with [place] nephrologist later this month.   Discharge Exam:   BP (!) 157/66 (BP Location: Left Arm)   Pulse (!) 58   Temp 98.2 F (36.8 C) (Oral)   Resp 19   Ht '5\' 9"'$  (1.753 m)   Wt 87.2 kg   SpO2 96%   BMI 28.39 kg/m  Constitutional:  well-appearing male laying in bed comfortably, in no acute distress HENT: normocephalic atraumatic Cardiovascular: regular rate, no pitting LE edema Pulmonary/Chest: normal work of breathing on room air, lungs clear to auscultation bilaterally MSK: normal bulk and tone Neurological: alert & oriented x 3 Skin: warm and dry Psych: pleasant mood   Pertinent Labs, Studies, and Procedures:     Latest Ref Rng & Units 09/29/2022   12:49 AM 09/28/2022    3:45 PM 09/03/2018    7:24 AM  CBC  WBC 4.0 - 10.5 K/uL 5.5  4.8  6.5   Hemoglobin 13.0 - 17.0 g/dL 10.9  10.6  12.5   Hematocrit 39.0 - 52.0 % 31.7  31.0  38.6   Platelets 150 - 400 K/uL 201  191  168        Latest Ref Rng & Units 10/01/2022   12:44 AM 09/30/2022   12:26 AM 09/29/2022   12:49 AM  CMP  Glucose 70 - 99 mg/dL 122  136  112   BUN 8 - 23 mg/dL 32  28  30   Creatinine 0.61 - 1.24 mg/dL 4.31  4.30  4.37   Sodium 135 - 145 mmol/L 138  138  142   Potassium 3.5 - 5.1 mmol/L 3.4  3.2  3.5   Chloride 98 - 111 mmol/L 106  104  110   CO2 22 - 32 mmol/L '24  24   22   '$ Calcium 8.9 - 10.3 mg/dL 8.3  8.3  8.8     ECHOCARDIOGRAM COMPLETE  Result Date: 09/29/2022    ECHOCARDIOGRAM REPORT   Patient Name:   Karl Robinson Date of Exam: 09/29/2022 Medical Rec #:  [id number]      Height:       69.0 in Accession #:    [id number]     Weight:       201.0 lb Date of Birth:  1950-07-31      BSA:          2.070 m Patient Age:    29 years       BP:           160/64 mmHg Patient Gender: M              HR:           65 bpm. Exam Location:  Inpatient Procedure: 2D Echo, Color Doppler and Cardiac Doppler Indications:    CHF  History:        Patient has no prior history of Echocardiogram examinations.                 CHF; Risk Factors:Diabetes and Dyslipidemia.  Sonographer:    Meagan Baucom RDCS, FE, PE Referring Phys: [place]  1. Left ventricular ejection fraction, by estimation, is 60 to 65%. The left ventricle has normal function. The left ventricle has no regional wall motion abnormalities. There is moderate asymmetric left ventricular hypertrophy of the inferior segment. Left ventricular diastolic parameters are consistent with Grade II diastolic dysfunction (pseudonormalization).  2. Right ventricular systolic function is normal. The right ventricular size is normal.  3. Left atrial size was mildly dilated.  4. A small pericardial effusion is present. The pericardial effusion is circumferential.  5. The mitral valve is normal in structure. Mild to moderate mitral valve regurgitation. No evidence of mitral stenosis.  6. The aortic valve is tricuspid. Aortic valve regurgitation is not visualized.  7. The inferior vena cava is normal in size  with greater than 50% respiratory variability, suggesting right atrial pressure of 3 mmHg. Comparison(s): Unable to view prior. FINDINGS  Left Ventricle: In the setting of elevated posterior thickness, pericardial effusion, and E/e' values, outpatient pyrophospate imaging may be considered. Left ventricular ejection fraction,  by estimation, is 60 to 65%. The left ventricle has normal function. The left ventricle has no regional wall motion abnormalities. The left ventricular internal cavity size was normal in size. There is moderate asymmetric left ventricular hypertrophy of the inferior segment. Left ventricular diastolic parameters  are consistent with Grade II diastolic dysfunction (pseudonormalization). Right Ventricle: The right ventricular size is normal. No increase in right ventricular wall thickness. Right ventricular systolic function is normal. Left Atrium: Left atrial size was mildly dilated. Right Atrium: Right atrial size was normal in size. Pericardium: A small pericardial effusion is present. The pericardial effusion is circumferential. Mitral Valve: The mitral valve is normal in structure. Mild to moderate mitral valve regurgitation. No evidence of mitral valve stenosis. Tricuspid Valve: The tricuspid valve is normal in structure. Tricuspid valve regurgitation is mild . No evidence of tricuspid stenosis. Aortic Valve: The aortic valve is tricuspid. Aortic valve regurgitation is not visualized. Aortic valve mean gradient measures 6.0 mmHg. Aortic valve peak gradient measures 12.5 mmHg. Aortic valve area, by VTI measures 1.83 cm. Pulmonic Valve: The pulmonic valve was normal in structure. Pulmonic valve regurgitation is not visualized. No evidence of pulmonic stenosis. Aorta: The aortic root and ascending aorta are structurally normal, with no evidence of dilitation. Venous: The inferior vena cava is normal in size with greater than 50% respiratory variability, suggesting right atrial pressure of 3 mmHg. IAS/Shunts: No atrial level shunt detected by color flow Doppler.  LEFT VENTRICLE PLAX 2D LVIDd:         5.40 cm   Diastology LVIDs:         3.50 cm   LV e' medial:    6.37 cm/s LV PW:         1.20 cm   LV E/e' medial:  14.2 LV IVS:        1.00 cm   LV e' lateral:   8.39 cm/s LVOT diam:     1.80 cm   LV E/e' lateral: 10.8  LV SV:         69 LV SV Index:   34 LVOT Area:     2.54 cm  RIGHT VENTRICLE RV S prime:     13.80 cm/s TAPSE (M-mode): 1.9 cm LEFT ATRIUM             Index        RIGHT ATRIUM           Index LA diam:        4.50 cm 2.17 cm/m   RA Area:     22.30 cm LA Vol (A2C):   73.1 ml 35.31 ml/m  RA Volume:   63.40 ml  30.62 ml/m LA Vol (A4C):   84.7 ml 40.91 ml/m LA Biplane Vol: 81.9 ml 39.56 ml/m  AORTIC VALVE AV Area (Vmax):    1.93 cm AV Area (Vmean):   1.83 cm AV Area (VTI):     1.83 cm AV Vmax:           177.00 cm/s AV Vmean:          115.000 cm/s AV VTI:            0.380 m AV Peak Grad:      12.5 mmHg  AV Mean Grad:      6.0 mmHg LVOT Vmax:         134.00 cm/s LVOT Vmean:        82.700 cm/s LVOT VTI:          0.273 m LVOT/AV VTI ratio: 0.72  AORTA Ao Root diam: 2.70 cm Ao Asc diam:  3.20 cm MITRAL VALVE               TRICUSPID VALVE MV Area (PHT): 3.60 cm    TR Peak grad:   29.4 mmHg MV Decel Time: 211 msec    TR Vmax:        271.00 cm/s MV E velocity: 90.70 cm/s MV A velocity: 77.10 cm/s  SHUNTS MV E/A ratio:  1.18        Systemic VTI:  0.27 m                            Systemic Diam: 1.80 cm Rudean Haskell MD Electronically signed by Rudean Haskell MD Signature Date/Time: 09/29/2022/1:47:54 PM    Final    DG Chest 2 View  Result Date: 09/28/2022 CLINICAL DATA:  Shortness of breath and chills. EXAM: CHEST - 2 VIEW COMPARISON:  10/24/2003 chest radiograph FINDINGS: The cardiomediastinal silhouette is unremarkable. Equivocal mild LEFT basilar opacity noted. There is no evidence of pulmonary mass, consolidation, pneumothorax or pleural effusion. No acute bony abnormalities are identified. IMPRESSION: Equivocal mild LEFT basilar opacity which may represent atelectasis or airspace disease/pneumonia. Electronically Signed   By: Margarette Canada M.D.   On: 09/28/2022 16:43     Discharge Instructions: Discharge Instructions     (HEART FAILURE PATIENTS) Call MD:  Anytime you have any of the following  symptoms: 1) 3 pound weight gain in 24 hours or 5 pounds in 1 week 2) shortness of breath, with or without a dry hacking cough 3) swelling in the hands, feet or stomach 4) if you have to sleep on extra pillows at night in order to breathe.   Complete by: As directed    Call MD for:  difficulty breathing, headache or visual disturbances   Complete by: As directed    Call MD for:  extreme fatigue   Complete by: As directed    Call MD for:  hives   Complete by: As directed    Call MD for:  persistant dizziness or light-headedness   Complete by: As directed    Call MD for:  persistant nausea and vomiting   Complete by: As directed    Call MD for:  redness, tenderness, or signs of infection (pain, swelling, redness, odor or green/yellow discharge around incision site)   Complete by: As directed    Call MD for:  severe uncontrolled pain   Complete by: As directed    Call MD for:  temperature >100.4   Complete by: As directed    Diet - low sodium heart healthy   Complete by: As directed    Increase activity slowly   Complete by: As directed        Signed: Angelique Blonder, DO 10/01/2022, 1:27 PM   Pager: [phone]

## 2022-10-01 NOTE — Progress Notes (Signed)
Subjective:  No complaints today and continues to endorse feeling better with time.  Objective Vital signs in last 24 hours: Vitals:   10/01/22 0029 10/01/22 0100 10/01/22 0200 10/01/22 0431  BP: (!) 157/62   (!) 157/66  Pulse:  (!) 59 64 (!) 58  Resp:    19  Temp: 98.2 F (36.8 C)   98.2 F (36.8 C)  TempSrc: Oral   Oral  SpO2:  96% 95% 96%  Weight: 87.2 kg     Height:        Intake/Output Summary (Last 24 hours) at 10/01/2022 0950 Last data filed at 09/30/2022 2000 Gross per 24 hour  Intake 240 ml  Output 1560 ml  Net -1320 ml   Labs: Basic Metabolic Panel: Recent Labs  Lab 09/29/22 0049 09/30/22 0026 10/01/22 0044  NA 142 138 138  K 3.5 3.2* 3.4*  CL 110 104 106  CO2 '22 24 24  '$ GLUCOSE 112* 136* 122*  BUN 30* 28* 32*  CREATININE 4.37* 4.30* 4.31*  CALCIUM 8.8* 8.3* 8.3*  PHOS  --   --  4.1   Liver Function Tests: Recent Labs  Lab 10/01/22 0044  ALBUMIN 2.5*   No results for input(s): "LIPASE", "AMYLASE" in the last 168 hours. No results for input(s): "AMMONIA" in the last 168 hours. CBC: Recent Labs  Lab 09/28/22 1545 09/29/22 0049  WBC 4.8 5.5  NEUTROABS 3.6  --   HGB 10.6* 10.9*  HCT 31.0* 31.7*  MCV 77.7* 76.6*  PLT 191 201   Cardiac Enzymes: No results for input(s): "CKTOTAL", "CKMB", "CKMBINDEX", "TROPONINI" in the last 168 hours. CBG: No results for input(s): "GLUCAP" in the last 168 hours.  Iron Studies:  Recent Labs    09/28/22 2300  IRON 44*  TIBC 245*  FERRITIN 290   Studies/Results: ECHOCARDIOGRAM COMPLETE  Result Date: 09/29/2022    ECHOCARDIOGRAM REPORT   Patient Name:   Karl Robinson Date of Exam: 09/29/2022 Medical Rec #:  [id number]      Height:       69.0 in Accession #:    [id number]     Weight:       201.0 lb Date of Birth:  09-08-1950      BSA:          2.070 m Patient Age:    72 years       BP:           160/64 mmHg Patient Gender: M              HR:           65 bpm. Exam Location:  Inpatient Procedure: 2D Echo, Color  Doppler and Cardiac Doppler Indications:    CHF  History:        Patient has no prior history of Echocardiogram examinations.                 CHF; Risk Factors:Diabetes and Dyslipidemia.  Sonographer:    Meagan Baucom RDCS, FE, PE Referring Phys: [place]  1. Left ventricular ejection fraction, by estimation, is 60 to 65%. The left ventricle has normal function. The left ventricle has no regional wall motion abnormalities. There is moderate asymmetric left ventricular hypertrophy of the inferior segment. Left ventricular diastolic parameters are consistent with Grade II diastolic dysfunction (pseudonormalization).  2. Right ventricular systolic function is normal. The right ventricular size is normal.  3. Left atrial size was mildly dilated.  4. A small pericardial  effusion is present. The pericardial effusion is circumferential.  5. The mitral valve is normal in structure. Mild to moderate mitral valve regurgitation. No evidence of mitral stenosis.  6. The aortic valve is tricuspid. Aortic valve regurgitation is not visualized.  7. The inferior vena cava is normal in size with greater than 50% respiratory variability, suggesting right atrial pressure of 3 mmHg. Comparison(s): Unable to view prior. FINDINGS  Left Ventricle: In the setting of elevated posterior thickness, pericardial effusion, and E/e' values, outpatient pyrophospate imaging may be considered. Left ventricular ejection fraction, by estimation, is 60 to 65%. The left ventricle has normal function. The left ventricle has no regional wall motion abnormalities. The left ventricular internal cavity size was normal in size. There is moderate asymmetric left ventricular hypertrophy of the inferior segment. Left ventricular diastolic parameters  are consistent with Grade II diastolic dysfunction (pseudonormalization). Right Ventricle: The right ventricular size is normal. No increase in right ventricular wall thickness. Right  ventricular systolic function is normal. Left Atrium: Left atrial size was mildly dilated. Right Atrium: Right atrial size was normal in size. Pericardium: A small pericardial effusion is present. The pericardial effusion is circumferential. Mitral Valve: The mitral valve is normal in structure. Mild to moderate mitral valve regurgitation. No evidence of mitral valve stenosis. Tricuspid Valve: The tricuspid valve is normal in structure. Tricuspid valve regurgitation is mild . No evidence of tricuspid stenosis. Aortic Valve: The aortic valve is tricuspid. Aortic valve regurgitation is not visualized. Aortic valve mean gradient measures 6.0 mmHg. Aortic valve peak gradient measures 12.5 mmHg. Aortic valve area, by VTI measures 1.83 cm. Pulmonic Valve: The pulmonic valve was normal in structure. Pulmonic valve regurgitation is not visualized. No evidence of pulmonic stenosis. Aorta: The aortic root and ascending aorta are structurally normal, with no evidence of dilitation. Venous: The inferior vena cava is normal in size with greater than 50% respiratory variability, suggesting right atrial pressure of 3 mmHg. IAS/Shunts: No atrial level shunt detected by color flow Doppler.  LEFT VENTRICLE PLAX 2D LVIDd:         5.40 cm   Diastology LVIDs:         3.50 cm   LV e' medial:    6.37 cm/s LV PW:         1.20 cm   LV E/e' medial:  14.2 LV IVS:        1.00 cm   LV e' lateral:   8.39 cm/s LVOT diam:     1.80 cm   LV E/e' lateral: 10.8 LV SV:         69 LV SV Index:   34 LVOT Area:     2.54 cm  RIGHT VENTRICLE RV S prime:     13.80 cm/s TAPSE (M-mode): 1.9 cm LEFT ATRIUM             Index        RIGHT ATRIUM           Index LA diam:        4.50 cm 2.17 cm/m   RA Area:     22.30 cm LA Vol (A2C):   73.1 ml 35.31 ml/m  RA Volume:   63.40 ml  30.62 ml/m LA Vol (A4C):   84.7 ml 40.91 ml/m LA Biplane Vol: 81.9 ml 39.56 ml/m  AORTIC VALVE AV Area (Vmax):    1.93 cm AV Area (Vmean):   1.83 cm AV Area (VTI):     1.83 cm AV  Vmax:           177.00 cm/s AV Vmean:          115.000 cm/s AV VTI:            0.380 m AV Peak Grad:      12.5 mmHg AV Mean Grad:      6.0 mmHg LVOT Vmax:         134.00 cm/s LVOT Vmean:        82.700 cm/s LVOT VTI:          0.273 m LVOT/AV VTI ratio: 0.72  AORTA Ao Root diam: 2.70 cm Ao Asc diam:  3.20 cm MITRAL VALVE               TRICUSPID VALVE MV Area (PHT): 3.60 cm    TR Peak grad:   29.4 mmHg MV Decel Time: 211 msec    TR Vmax:        271.00 cm/s MV E velocity: 90.70 cm/s MV A velocity: 77.10 cm/s  SHUNTS MV E/A ratio:  1.18        Systemic VTI:  0.27 m                            Systemic Diam: 1.80 cm Rudean Haskell MD Electronically signed by Rudean Haskell MD Signature Date/Time: 09/29/2022/1:47:54 PM    Final     Medications: Infusions:  ferric gluconate (FERRLECIT) IVPB 250 mg (10/01/22 0924)   magnesium sulfate bolus IVPB      Scheduled Medications:  allopurinol  300 mg Oral Daily   amLODipine  10 mg Oral Daily   aspirin EC  81 mg Oral Daily   cloNIDine  0.3 mg Oral TID   ferrous sulfate  325 mg Oral Q M,W,F   finasteride  5 mg Oral Daily   furosemide  60 mg Intravenous Daily   gabapentin  100 mg Oral BID   heparin  5,000 Units Subcutaneous Q8H   melatonin  3 mg Oral QHS   pantoprazole  40 mg Oral Daily   potassium chloride  20 mEq Oral BID   prazosin  4 mg Oral QHS   rosuvastatin  10 mg Oral Daily   I have reviewed scheduled and prn medications.  Physical Exam: Constitutional:Elderly gentleman resting comfortably in bed. In no acute distress. Cardio:No JVP appreciated today. No pitting edema to peripheral extremities. Pulm:Normal work of breathing on room air. Abdomen:Soft, nontender, nondistended. Skin:Warm and dry. Neuro:Alert and oriented x3. No focal deficit noted. Psych:Pleasant mood and affect.  Assessment/Plan: CKD stage 4 versus progression to stage 5 HFpEF exacerbation Renal function stable. He is diuresing well without impact on renal  function. On exam, he appears euvolemic.   AM labs reviewed: K 3.4 BUN 32 (28) Cr 4.31 (4.30) Ca 8.3 (8.3) Albumin 2.5 GFR 14 (14) UOP last 24h 1.56L (slowing down) Net -6.0 L  Plan: -Continue IV lasix 60 mg daily; transition to lasix 40 mg PO BID at discharge -Replete electrolytes as needed -Strict I's/O;s -Daily weights -Avoid nephrotoxic agents, renally dose medications -Trend renal function -OK to discharge with close outpatient follow up from our standpoint  HTN OSA Remains mildly hypertensive with ongoing diuresis, SBP consistently in 150s. Current management with amlodipine 10 mg daily. Plan: -Continue home amlodipine 10 mg daily -Do not feel strongly about adding additional anti-hypertensive agent at discharge; will defer to primary team and outpatient nephrologist -CPAP nightly   Anemia of  chronic kidney disease History of iron deficiency History of microcytic anemia, on ferrous sulfate 325 mg MWF. CBC and iron studies consistent with anemia of chronic disease, likely due to chronic kidney disease. Iron studies are also low. Plan: -Ferrlecit 250 mg IV daily, day 2/2 -Continue ferrous sulfate 325 mg MWF -Trend CBC     Additional hospital problems managed by primary team: Acute Hypoxic Respiratory Failure Possible Stable Angina HLD BPH T2DM PTSD Insomnia DDD Cervical stenosis Vitamin D Deficiency  GERD Gout Primary Hyperaldosteronism Adrenal Hyperplasia  Farrel Gordon, DO Internal Medicine PGY-2 10/01/2022,9:50 AM  LOS: 2 days

## 2022-12-07 ENCOUNTER — Other Ambulatory Visit: Payer: Self-pay

## 2022-12-07 ENCOUNTER — Encounter (HOSPITAL_COMMUNITY): Payer: Self-pay

## 2022-12-07 ENCOUNTER — Emergency Department (HOSPITAL_COMMUNITY): Payer: No Typology Code available for payment source

## 2022-12-07 ENCOUNTER — Inpatient Hospital Stay (HOSPITAL_COMMUNITY)
Admission: EM | Admit: 2022-12-07 | Discharge: 2022-12-09 | DRG: 391 | Disposition: A | Discharge status: Home / Self Care | Payer: No Typology Code available for payment source | Attending: Internal Medicine | Admitting: Internal Medicine

## 2022-12-07 DIAGNOSIS — K5732 Diverticulitis of large intestine without perforation or abscess without bleeding: Secondary | ICD-10-CM | POA: Diagnosis not present

## 2022-12-07 DIAGNOSIS — E1122 Type 2 diabetes mellitus with diabetic chronic kidney disease: Secondary | ICD-10-CM | POA: Diagnosis present

## 2022-12-07 DIAGNOSIS — N186 End stage renal disease: Secondary | ICD-10-CM | POA: Diagnosis present

## 2022-12-07 DIAGNOSIS — I5032 Chronic diastolic (congestive) heart failure: Secondary | ICD-10-CM | POA: Diagnosis present

## 2022-12-07 DIAGNOSIS — K5792 Diverticulitis of intestine, part unspecified, without perforation or abscess without bleeding: Secondary | ICD-10-CM | POA: Diagnosis not present

## 2022-12-07 DIAGNOSIS — I152 Hypertension secondary to endocrine disorders: Secondary | ICD-10-CM | POA: Diagnosis present

## 2022-12-07 DIAGNOSIS — E876 Hypokalemia: Secondary | ICD-10-CM | POA: Diagnosis present

## 2022-12-07 DIAGNOSIS — H908 Mixed conductive and sensorineural hearing loss, unspecified: Secondary | ICD-10-CM | POA: Diagnosis present

## 2022-12-07 DIAGNOSIS — Z885 Allergy status to narcotic agent status: Secondary | ICD-10-CM

## 2022-12-07 DIAGNOSIS — M109 Gout, unspecified: Secondary | ICD-10-CM | POA: Diagnosis present

## 2022-12-07 DIAGNOSIS — E2609 Other primary hyperaldosteronism: Secondary | ICD-10-CM | POA: Diagnosis present

## 2022-12-07 DIAGNOSIS — E785 Hyperlipidemia, unspecified: Secondary | ICD-10-CM | POA: Diagnosis present

## 2022-12-07 DIAGNOSIS — I132 Hypertensive heart and chronic kidney disease with heart failure and with stage 5 chronic kidney disease, or end stage renal disease: Secondary | ICD-10-CM | POA: Diagnosis present

## 2022-12-07 DIAGNOSIS — Z79899 Other long term (current) drug therapy: Secondary | ICD-10-CM

## 2022-12-07 DIAGNOSIS — Z7982 Long term (current) use of aspirin: Secondary | ICD-10-CM

## 2022-12-07 DIAGNOSIS — I4891 Unspecified atrial fibrillation: Secondary | ICD-10-CM | POA: Diagnosis present

## 2022-12-07 DIAGNOSIS — F4310 Post-traumatic stress disorder, unspecified: Secondary | ICD-10-CM | POA: Diagnosis present

## 2022-12-07 DIAGNOSIS — N184 Chronic kidney disease, stage 4 (severe): Secondary | ICD-10-CM | POA: Diagnosis present

## 2022-12-07 DIAGNOSIS — Z1152 Encounter for screening for COVID-19: Secondary | ICD-10-CM

## 2022-12-07 DIAGNOSIS — K219 Gastro-esophageal reflux disease without esophagitis: Secondary | ICD-10-CM | POA: Diagnosis present

## 2022-12-07 DIAGNOSIS — I159 Secondary hypertension, unspecified: Secondary | ICD-10-CM | POA: Diagnosis present

## 2022-12-07 DIAGNOSIS — G4733 Obstructive sleep apnea (adult) (pediatric): Secondary | ICD-10-CM | POA: Diagnosis present

## 2022-12-07 DIAGNOSIS — N400 Enlarged prostate without lower urinary tract symptoms: Secondary | ICD-10-CM | POA: Diagnosis present

## 2022-12-07 DIAGNOSIS — Z813 Family history of other psychoactive substance abuse and dependence: Secondary | ICD-10-CM

## 2022-12-07 DIAGNOSIS — Z823 Family history of stroke: Secondary | ICD-10-CM

## 2022-12-07 DIAGNOSIS — Z888 Allergy status to other drugs, medicaments and biological substances status: Secondary | ICD-10-CM

## 2022-12-07 DIAGNOSIS — Z8249 Family history of ischemic heart disease and other diseases of the circulatory system: Secondary | ICD-10-CM

## 2022-12-07 LAB — URINALYSIS, ROUTINE W REFLEX MICROSCOPIC
Bacteria, UA: NONE SEEN
Bilirubin Urine: NEGATIVE
Glucose, UA: 50 mg/dL — AB
Hgb urine dipstick: NEGATIVE
Ketones, ur: NEGATIVE mg/dL
Leukocytes,Ua: NEGATIVE
Nitrite: NEGATIVE
Protein, ur: >=300 mg/dL — AB
Specific Gravity, Urine: 1.011 (ref 1.005–1.030)
pH: 8 (ref 5.0–8.0)

## 2022-12-07 LAB — COMPREHENSIVE METABOLIC PANEL
ALT: 15 U/L (ref 0–44)
AST: 27 U/L (ref 15–41)
Albumin: 3.8 g/dL (ref 3.5–5.0)
Alkaline Phosphatase: 106 U/L (ref 38–126)
Anion gap: 16 — ABNORMAL HIGH (ref 5–15)
BUN: 41 mg/dL — ABNORMAL HIGH (ref 8–23)
CO2: 20 mmol/L — ABNORMAL LOW (ref 22–32)
Calcium: 9.2 mg/dL (ref 8.9–10.3)
Chloride: 104 mmol/L (ref 98–111)
Creatinine, Ser: 4.91 mg/dL — ABNORMAL HIGH (ref 0.61–1.24)
GFR, Estimated: 12 mL/min — ABNORMAL LOW (ref >60)
Glucose, Bld: 156 mg/dL — ABNORMAL HIGH (ref 70–99)
Potassium: 3.3 mmol/L — ABNORMAL LOW (ref 3.5–5.1)
Sodium: 140 mmol/L (ref 135–145)
Total Bilirubin: 0.9 mg/dL (ref 0.3–1.2)
Total Protein: 6.9 g/dL (ref 6.5–8.1)

## 2022-12-07 LAB — CBC WITH DIFFERENTIAL/PLATELET
Abs Immature Granulocytes: 0.02 10*3/uL (ref 0.00–0.07)
Basophils Absolute: 0 10*3/uL (ref 0.0–0.1)
Basophils Relative: 0 %
Eosinophils Absolute: 0 10*3/uL (ref 0.0–0.5)
Eosinophils Relative: 0 %
HCT: 38.4 % — ABNORMAL LOW (ref 39.0–52.0)
Hemoglobin: 12.9 g/dL — ABNORMAL LOW (ref 13.0–17.0)
Immature Granulocytes: 0 %
Lymphocytes Relative: 9 %
Lymphs Abs: 0.6 10*3/uL — ABNORMAL LOW (ref 0.7–4.0)
MCH: 26.8 pg (ref 26.0–34.0)
MCHC: 33.6 g/dL (ref 30.0–36.0)
MCV: 79.7 fL — ABNORMAL LOW (ref 80.0–100.0)
Monocytes Absolute: 0.2 10*3/uL (ref 0.1–1.0)
Monocytes Relative: 2 %
Neutro Abs: 6.2 10*3/uL (ref 1.7–7.7)
Neutrophils Relative %: 89 %
Platelets: 203 10*3/uL (ref 150–400)
RBC: 4.82 MIL/uL (ref 4.22–5.81)
RDW: 13.6 % (ref 11.5–15.5)
WBC: 7 10*3/uL (ref 4.0–10.5)
nRBC: 0 % (ref 0.0–0.2)

## 2022-12-07 LAB — LIPASE, BLOOD: Lipase: 55 U/L — ABNORMAL HIGH (ref 11–51)

## 2022-12-07 LAB — TROPONIN I (HIGH SENSITIVITY): Troponin I (High Sensitivity): 51 ng/L — ABNORMAL HIGH (ref <18)

## 2022-12-07 LAB — BRAIN NATRIURETIC PEPTIDE: B Natriuretic Peptide: 516.4 pg/mL — ABNORMAL HIGH (ref 0.0–100.0)

## 2022-12-07 MED ORDER — ONDANSETRON HCL 4 MG/2ML IJ SOLN
4.0000 mg | Freq: Once | INTRAMUSCULAR | Status: AC
  Administered 2022-12-07: 4 mg via INTRAVENOUS
  Filled 2022-12-07: qty 2

## 2022-12-07 MED ORDER — MORPHINE SULFATE (PF) 4 MG/ML IV SOLN
6.0000 mg | Freq: Once | INTRAVENOUS | Status: AC
  Administered 2022-12-07: 6 mg via INTRAVENOUS
  Filled 2022-12-07: qty 2

## 2022-12-07 MED ORDER — METOCLOPRAMIDE HCL 5 MG/ML IJ SOLN
10.0000 mg | Freq: Once | INTRAMUSCULAR | Status: AC
  Administered 2022-12-07: 10 mg via INTRAVENOUS
  Filled 2022-12-07: qty 2

## 2022-12-07 MED ORDER — INSULIN ASPART 100 UNIT/ML IJ SOLN
0.0000 [IU] | Freq: Three times a day (TID) | INTRAMUSCULAR | Status: DC
  Administered 2022-12-08: 2 [IU] via SUBCUTANEOUS
  Filled 2022-12-07: qty 0.15

## 2022-12-07 MED ORDER — METOPROLOL TARTRATE 5 MG/5ML IV SOLN
5.0000 mg | INTRAVENOUS | Status: DC | PRN
  Administered 2022-12-08: 5 mg via INTRAVENOUS
  Filled 2022-12-07: qty 5

## 2022-12-07 MED ORDER — INSULIN ASPART 100 UNIT/ML IJ SOLN
0.0000 [IU] | Freq: Every day | INTRAMUSCULAR | Status: DC
  Filled 2022-12-07: qty 0.05

## 2022-12-07 MED ORDER — METOPROLOL TARTRATE 5 MG/5ML IV SOLN
5.0000 mg | Freq: Once | INTRAVENOUS | Status: AC
  Administered 2022-12-07: 5 mg via INTRAVENOUS
  Filled 2022-12-07: qty 5

## 2022-12-07 MED ORDER — MORPHINE SULFATE (PF) 2 MG/ML IV SOLN
2.0000 mg | INTRAVENOUS | Status: DC | PRN
  Administered 2022-12-08: 2 mg via INTRAVENOUS
  Filled 2022-12-07: qty 1

## 2022-12-07 MED ORDER — METRONIDAZOLE 500 MG/100ML IV SOLN
500.0000 mg | Freq: Once | INTRAVENOUS | Status: AC
  Administered 2022-12-07: 500 mg via INTRAVENOUS
  Filled 2022-12-07: qty 100

## 2022-12-07 MED ORDER — SODIUM CHLORIDE 0.9 % IV BOLUS: 500.0000 mL | Freq: Once | INTRAVENOUS | Status: DC

## 2022-12-07 MED ORDER — DILTIAZEM HCL 25 MG/5ML IV SOLN
10.0000 mg | Freq: Once | INTRAVENOUS | Status: AC
  Administered 2022-12-07: 10 mg via INTRAVENOUS
  Filled 2022-12-07: qty 5

## 2022-12-07 MED ORDER — ONDANSETRON HCL 4 MG/2ML IJ SOLN: 4.0000 mg | Freq: Four times a day (QID) | INTRAMUSCULAR | Status: DC | PRN

## 2022-12-07 MED ORDER — LORAZEPAM 2 MG/ML IJ SOLN
0.5000 mg | Freq: Once | INTRAMUSCULAR | Status: AC
  Administered 2022-12-07: 0.5 mg via INTRAVENOUS
  Filled 2022-12-07: qty 1

## 2022-12-07 MED ORDER — SODIUM CHLORIDE 0.9 % IV BOLUS
1000.0000 mL | Freq: Once | INTRAVENOUS | Status: AC
  Administered 2022-12-07: 1000 mL via INTRAVENOUS

## 2022-12-07 MED ORDER — MORPHINE SULFATE (PF) 4 MG/ML IV SOLN
4.0000 mg | Freq: Once | INTRAVENOUS | Status: AC
  Administered 2022-12-07: 4 mg via INTRAVENOUS
  Filled 2022-12-07: qty 1

## 2022-12-07 MED ORDER — PANTOPRAZOLE SODIUM 40 MG IV SOLR
40.0000 mg | Freq: Once | INTRAVENOUS | Status: AC
  Administered 2022-12-07: 40 mg via INTRAVENOUS
  Filled 2022-12-07: qty 10

## 2022-12-07 MED ORDER — CIPROFLOXACIN IN D5W 400 MG/200ML IV SOLN
400.0000 mg | Freq: Once | INTRAVENOUS | Status: AC
  Administered 2022-12-07: 400 mg via INTRAVENOUS
  Filled 2022-12-07: qty 200

## 2022-12-07 NOTE — ED Triage Notes (Signed)
BIB EMS from home for upper abdominal pain for a day that has worsened the last few hours. 7 episodes of vomiting today. Pt is a new dialysis pt and has his first tx this Wednesday.  Pt is writhing in pain, denies pain radiating into back

## 2022-12-07 NOTE — ED Provider Notes (Signed)
[facility] AT [facility] Provider Note   CSN: [id number] Arrival date & time: 12/07/22  2102     History {Add pertinent medical, surgical, social history, OB history to HPI:1} Chief Complaint  Patient presents with   Abdominal Pain   Emesis    Karl Robinson is a 72 y.o. male.  Patient has a history of hypertension and diabetes with heart failure and patient kidney disease.  He is supposed to start dialysis in 2 days.  Patient complains of abdominal pain  The history is provided by the patient and medical records. No language interpreter was used.  Abdominal Pain Pain location:  Periumbilical Pain quality: aching   Pain radiates to:  Does not radiate Pain severity:  Moderate Onset quality:  Sudden Timing:  Constant Progression:  Worsening Chronicity:  New Associated symptoms: vomiting   Associated symptoms: no chest pain, no cough, no diarrhea, no fatigue and no hematuria   Emesis Associated symptoms: abdominal pain   Associated symptoms: no cough, no diarrhea and no headaches        Home Medications Prior to Admission medications   Medication Sig Start Date End Date Taking? Authorizing Provider  acetaminophen (TYLENOL) 500 MG tablet Take 500 mg by mouth 2 (two) times daily.    [provider]  allopurinol (ZYLOPRIM) 300 MG tablet Take 300 mg by mouth daily.    [provider]  amLODipine (NORVASC) 10 MG tablet Take 10 mg by mouth daily.    [provider]  aspirin 81 MG tablet Take 81 mg by mouth daily.    [provider]  Cholecalciferol (VITAMIN D3 PO) Take 2,000 Units by mouth daily.    [provider]  cloNIDine (CATAPRES) 0.3 MG tablet Take 0.3 mg by mouth 3 (three) times daily.    [provider]  dutasteride (AVODART) 0.5 MG capsule Take 0.5 mg by mouth daily.    [provider]  ferrous sulfate 325 (65 FE) MG tablet Take 325 mg by mouth every Monday, Wednesday, and  Friday.    [provider]  furosemide (LASIX) 40 MG tablet Take 1 tablet (40 mg total) by mouth 2 (two) times daily. 10/01/22 10/31/22  Rana Snare, DO  Multiple Vitamin (MULTIVITAMIN PO) Take 1 tablet by mouth in the morning.    [provider]  pantoprazole (PROTONIX) 40 MG tablet Take 1 tablet (40 mg total) by mouth daily. 10/02/22   Rana Snare, DO  potassium chloride SA (KLOR-CON M) 20 MEQ tablet Take 20 mEq by mouth 2 (two) times daily.    [provider]  prazosin (MINIPRESS) 2 MG capsule Take 4 mg by mouth at bedtime.    [provider]  rosuvastatin (CRESTOR) 10 MG tablet Take 1 tablet (10 mg total) by mouth daily. 10/02/22   Rana Snare, DO      Allergies    Hydromorphone hcl and Oxycodone hcl    Review of Systems   Review of Systems  Constitutional:  Negative for appetite change and fatigue.  HENT:  Negative for congestion, ear discharge and sinus pressure.   Eyes:  Negative for discharge.  Respiratory:  Negative for cough.   Cardiovascular:  Negative for chest pain.  Gastrointestinal:  Positive for abdominal pain and vomiting. Negative for diarrhea.  Genitourinary:  Negative for frequency and hematuria.  Musculoskeletal:  Negative for back pain.  Skin:  Negative for rash.  Neurological:  Negative for seizures and headaches.  Psychiatric/Behavioral:  Negative for  hallucinations.     Physical Exam Updated Vital Signs BP (!) 219/67   Pulse 92   Temp 98.6 F (37 C) (Oral)   Resp 18   Ht 5\' 9"  (1.753 m)   Wt 87.2 kg   SpO2 100%   BMI 28.39 kg/m  Physical Exam Vitals and nursing note reviewed.  Constitutional:      Appearance: He is well-developed.  HENT:     Head: Normocephalic.     Nose: Nose normal.  Eyes:     General: No scleral icterus.    Conjunctiva/sclera: Conjunctivae normal.  Neck:     Thyroid: No thyromegaly.  Cardiovascular:     Rate and Rhythm: Tachycardia present. Rhythm irregular.     Heart sounds: No  murmur heard.    No friction rub. No gallop.  Pulmonary:     Breath sounds: No stridor. No wheezing or rales.  Chest:     Chest wall: No tenderness.  Abdominal:     General: There is no distension.     Tenderness: There is abdominal tenderness. There is no rebound.  Musculoskeletal:        General: Normal range of motion.     Cervical back: Neck supple.  Lymphadenopathy:     Cervical: No cervical adenopathy.  Skin:    Findings: No erythema or rash.  Neurological:     Mental Status: He is alert and oriented to person, place, and time.     Motor: No abnormal muscle tone.     Coordination: Coordination normal.  Psychiatric:        Behavior: Behavior normal.     ED Results / Procedures / Treatments   Labs (all labs ordered are listed, but only abnormal results are displayed) Labs Reviewed  CBC WITH DIFFERENTIAL/PLATELET - Abnormal; Notable for the following components:      Result Value   Hemoglobin 12.9 (*)    HCT 38.4 (*)    MCV 79.7 (*)    Lymphs Abs 0.6 (*)    All other components within normal limits  COMPREHENSIVE METABOLIC PANEL - Abnormal; Notable for the following components:   Potassium 3.3 (*)    CO2 20 (*)    Glucose, Bld 156 (*)    BUN 41 (*)    Creatinine, Ser 4.91 (*)    GFR, Estimated 12 (*)    Anion gap 16 (*)    All other components within normal limits  LIPASE, BLOOD - Abnormal; Notable for the following components:   Lipase 55 (*)    All other components within normal limits  BRAIN NATRIURETIC PEPTIDE - Abnormal; Notable for the following components:   B Natriuretic Peptide 516.4 (*)    All other components within normal limits  TROPONIN I (HIGH SENSITIVITY) - Abnormal; Notable for the following components:   Troponin I (High Sensitivity) 51 (*)    All other components within normal limits  URINALYSIS, ROUTINE W REFLEX MICROSCOPIC  TROPONIN I (HIGH SENSITIVITY)    EKG EKG Interpretation  Date/Time:  Monday Dec 07 2022 21:19:49  EDT Ventricular Rate:  110 PR Interval:    QRS Duration: 99 QT Interval:  343 QTC Calculation: 443 R Axis:   82 Text Interpretation: Atrial fibrillation Ventricular bigeminy Borderline right axis deviation Repol abnrm suggests ischemia, lateral leads Artifact in lead(s) I III aVR aVL aVF V2 Confirmed by Bethann Berkshire [phone]) on 12/07/2022 10:38:47 PM  Radiology CT ABDOMEN PELVIS WO CONTRAST  Result Date: 12/07/2022 CLINICAL DATA:  Abdominal pain.  Seven episodes of vomiting. EXAM: CT ABDOMEN AND PELVIS WITHOUT CONTRAST TECHNIQUE: Multidetector CT imaging of the abdomen and pelvis was performed following the standard protocol without IV contrast. RADIATION DOSE REDUCTION: This exam was performed according to the departmental dose-optimization program which includes automated exposure control, adjustment of the mA and/or kV according to patient size and/or use of iterative reconstruction technique. COMPARISON:  None Available. FINDINGS: Lower chest: Small pericardial effusion. Hepatobiliary: Tiny hypodensity in the superior left hepatic lobe is too small to definitively characterize but likely represents a benign cyst or hemangioma. Unremarkable gallbladder and biliary tree. Pancreas: Unremarkable. Spleen: Unremarkable. Adrenals/Urinary Tract: Normal adrenal glands. Nonobstructing bilateral nephrolithiasis. No ureteral calculi or hydronephrosis. Diffuse bladder wall thickening. Stomach/Bowel: Normal caliber large and small bowel. Extensive sigmoid diverticulosis with subtle stranding about a few sigmoid diverticula. No appendicitis. Stomach is within normal limits. Vascular/Lymphatic: Aortic atherosclerosis. No enlarged abdominal or pelvic lymph nodes. Reproductive: Enlarged prostate. Other: Small volume low-density free fluid in the pelvis. No free intraperitoneal air. Musculoskeletal: No acute osseous abnormality. IMPRESSION: 1. Mild uncomplicated sigmoid diverticulitis. 2. Nonobstructing bilateral  nephrolithiasis. 3. Diffuse bladder wall thickening, possibly reflecting chronic outlet obstruction given the enlarged prostate. 4. Small volume free fluid in the pelvis. Aortic Atherosclerosis (ICD10-I70.0). Electronically Signed   By: Minerva Fester M.D.   On: 12/07/2022 22:41   DG Chest Port 1 View  Result Date: 12/07/2022 CLINICAL DATA:  Shortness of breath EXAM: PORTABLE CHEST 1 VIEW COMPARISON:  09/28/2022 FINDINGS: The heart size and mediastinal contours are within normal limits. Aortic atherosclerosis. Both lungs are clear. The visualized skeletal structures are unremarkable. IMPRESSION: No active disease. Electronically Signed   By: Jasmine Pang M.D.   On: 12/07/2022 21:50    Procedures Procedures  {Document cardiac monitor, telemetry assessment procedure when appropriate:1}  Medications Ordered in ED Medications  ciprofloxacin (CIPRO) IVPB 400 mg (400 mg Intravenous New Bag/Given 12/07/22 2311)  metroNIDAZOLE (FLAGYL) IVPB 500 mg (500 mg Intravenous New Bag/Given 12/07/22 2311)  ondansetron (ZOFRAN) injection 4 mg (4 mg Intravenous Given 12/07/22 2139)  morphine (PF) 4 MG/ML injection 6 mg (6 mg Intravenous Given 12/07/22 2135)  LORazepam (ATIVAN) injection 0.5 mg (0.5 mg Intravenous Given 12/07/22 2139)  pantoprazole (PROTONIX) injection 40 mg (40 mg Intravenous Given 12/07/22 2139)  sodium chloride 0.9 % bolus 1,000 mL (0 mLs Intravenous Stopped 12/07/22 2205)  metoCLOPramide (REGLAN) injection 10 mg (10 mg Intravenous Given 12/07/22 2310)  morphine (PF) 4 MG/ML injection 4 mg (4 mg Intravenous Given 12/07/22 2311)  diltiazem (CARDIZEM) injection 10 mg (10 mg Intravenous Given 12/07/22 2311)  metoprolol tartrate (LOPRESSOR) injection 5 mg (5 mg Intravenous Given 12/07/22 2310)    ED Course/ Medical Decision Making/ A&P   {  CRITICAL CARE Performed by: Bethann Berkshire Total critical care time: 45 minutes Critical care time was exclusive of separately billable procedures and treating  other patients. Critical care was necessary to treat or prevent imminent or life-threatening deterioration. Critical care was time spent personally by me on the following activities: development of treatment plan with patient and/or surrogate as well as nursing, discussions with consultants, evaluation of patient's response to treatment, examination of patient, obtaining history from patient or surrogate, ordering and performing treatments and interventions, ordering and review of laboratory studies, ordering and review of radiographic studies, pulse oximetry and re-evaluation of patient's condition.   Patient has been given diltiazem and Lopressor and his atrial fib is controlled with a rate of 94.  He also was given antibiotics and  pain medicine for his diverticulitis Click here for ABCD2, HEART and other calculatorsREFRESH Note before signing :1}                          Medical Decision Making Amount and/or Complexity of Data Reviewed Labs: ordered. Radiology: ordered. ECG/medicine tests: ordered.  Risk Prescription drug management.   Patient with new onset atrial fibs and diverticulitis.  He will be admitted to medicine.  {Document critical care time when appropriate:1} {Document review of labs and clinical decision tools ie heart score, Chads2Vasc2 etc:1}  {Document your independent review of radiology images, and any outside records:1} {Document your discussion with family members, caretakers, and with consultants:1} {Document social determinants of health affecting pt's care:1} {Document your decision making why or why not admission, treatments were needed:1} Final Clinical Impression(s) / ED Diagnoses Final diagnoses:  None    Rx / DC Orders ED Discharge Orders     None

## 2022-12-07 NOTE — ED Notes (Signed)
Pt vomiting.

## 2022-12-08 ENCOUNTER — Other Ambulatory Visit (HOSPITAL_COMMUNITY): Payer: Self-pay

## 2022-12-08 ENCOUNTER — Inpatient Hospital Stay (HOSPITAL_COMMUNITY): Payer: No Typology Code available for payment source

## 2022-12-08 ENCOUNTER — Encounter (HOSPITAL_COMMUNITY): Payer: Self-pay | Admitting: Family Medicine

## 2022-12-08 DIAGNOSIS — I4891 Unspecified atrial fibrillation: Secondary | ICD-10-CM

## 2022-12-08 DIAGNOSIS — F4310 Post-traumatic stress disorder, unspecified: Secondary | ICD-10-CM | POA: Diagnosis present

## 2022-12-08 DIAGNOSIS — Z888 Allergy status to other drugs, medicaments and biological substances status: Secondary | ICD-10-CM | POA: Diagnosis not present

## 2022-12-08 DIAGNOSIS — Z813 Family history of other psychoactive substance abuse and dependence: Secondary | ICD-10-CM | POA: Diagnosis not present

## 2022-12-08 DIAGNOSIS — I132 Hypertensive heart and chronic kidney disease with heart failure and with stage 5 chronic kidney disease, or end stage renal disease: Secondary | ICD-10-CM | POA: Diagnosis present

## 2022-12-08 DIAGNOSIS — K5792 Diverticulitis of intestine, part unspecified, without perforation or abscess without bleeding: Secondary | ICD-10-CM | POA: Diagnosis present

## 2022-12-08 DIAGNOSIS — K5732 Diverticulitis of large intestine without perforation or abscess without bleeding: Secondary | ICD-10-CM | POA: Diagnosis present

## 2022-12-08 DIAGNOSIS — E2609 Other primary hyperaldosteronism: Secondary | ICD-10-CM | POA: Diagnosis present

## 2022-12-08 DIAGNOSIS — Z823 Family history of stroke: Secondary | ICD-10-CM | POA: Diagnosis not present

## 2022-12-08 DIAGNOSIS — M109 Gout, unspecified: Secondary | ICD-10-CM

## 2022-12-08 DIAGNOSIS — N184 Chronic kidney disease, stage 4 (severe): Secondary | ICD-10-CM | POA: Diagnosis not present

## 2022-12-08 DIAGNOSIS — E876 Hypokalemia: Secondary | ICD-10-CM | POA: Diagnosis present

## 2022-12-08 DIAGNOSIS — E1122 Type 2 diabetes mellitus with diabetic chronic kidney disease: Secondary | ICD-10-CM | POA: Diagnosis present

## 2022-12-08 DIAGNOSIS — Z1152 Encounter for screening for COVID-19: Secondary | ICD-10-CM | POA: Diagnosis not present

## 2022-12-08 DIAGNOSIS — I509 Heart failure, unspecified: Secondary | ICD-10-CM | POA: Diagnosis not present

## 2022-12-08 DIAGNOSIS — Z885 Allergy status to narcotic agent status: Secondary | ICD-10-CM | POA: Diagnosis not present

## 2022-12-08 DIAGNOSIS — Z7982 Long term (current) use of aspirin: Secondary | ICD-10-CM | POA: Diagnosis not present

## 2022-12-08 DIAGNOSIS — N186 End stage renal disease: Secondary | ICD-10-CM | POA: Diagnosis present

## 2022-12-08 DIAGNOSIS — Z8249 Family history of ischemic heart disease and other diseases of the circulatory system: Secondary | ICD-10-CM | POA: Diagnosis not present

## 2022-12-08 DIAGNOSIS — I159 Secondary hypertension, unspecified: Secondary | ICD-10-CM | POA: Diagnosis present

## 2022-12-08 DIAGNOSIS — G4733 Obstructive sleep apnea (adult) (pediatric): Secondary | ICD-10-CM

## 2022-12-08 DIAGNOSIS — I5032 Chronic diastolic (congestive) heart failure: Secondary | ICD-10-CM | POA: Diagnosis present

## 2022-12-08 DIAGNOSIS — Z79899 Other long term (current) drug therapy: Secondary | ICD-10-CM | POA: Diagnosis not present

## 2022-12-08 DIAGNOSIS — I130 Hypertensive heart and chronic kidney disease with heart failure and stage 1 through stage 4 chronic kidney disease, or unspecified chronic kidney disease: Secondary | ICD-10-CM | POA: Diagnosis not present

## 2022-12-08 DIAGNOSIS — K219 Gastro-esophageal reflux disease without esophagitis: Secondary | ICD-10-CM | POA: Diagnosis present

## 2022-12-08 DIAGNOSIS — Z7901 Long term (current) use of anticoagulants: Secondary | ICD-10-CM | POA: Diagnosis not present

## 2022-12-08 DIAGNOSIS — N400 Enlarged prostate without lower urinary tract symptoms: Secondary | ICD-10-CM | POA: Diagnosis present

## 2022-12-08 DIAGNOSIS — E785 Hyperlipidemia, unspecified: Secondary | ICD-10-CM | POA: Diagnosis present

## 2022-12-08 DIAGNOSIS — R10816 Epigastric abdominal tenderness: Secondary | ICD-10-CM | POA: Diagnosis present

## 2022-12-08 DIAGNOSIS — K5780 Diverticulitis of intestine, part unspecified, with perforation and abscess without bleeding: Secondary | ICD-10-CM | POA: Diagnosis not present

## 2022-12-08 LAB — ECHOCARDIOGRAM COMPLETE
AR max vel: 1.88 cm2
AV Area VTI: 1.91 cm2
AV Area mean vel: 1.72 cm2
AV Mean grad: 6 mmHg
AV Peak grad: 9.5 mmHg
Ao pk vel: 1.54 m/s
Area-P 1/2: 4.21 cm2
Height: 69 in
S' Lateral: 3.7 cm
Weight: 3075.86 oz

## 2022-12-08 LAB — TSH: TSH: 0.292 u[IU]/mL — ABNORMAL LOW (ref 0.350–4.500)

## 2022-12-08 LAB — PROTIME-INR
INR: 1.1 (ref 0.8–1.2)
Prothrombin Time: 14.6 seconds (ref 11.4–15.2)

## 2022-12-08 LAB — HEMOGLOBIN A1C
Hgb A1c MFr Bld: 5.3 % (ref 4.8–5.6)
Mean Plasma Glucose: 105.41 mg/dL

## 2022-12-08 LAB — TROPONIN I (HIGH SENSITIVITY): Troponin I (High Sensitivity): 74 ng/L — ABNORMAL HIGH (ref <18)

## 2022-12-08 LAB — GLUCOSE, CAPILLARY
Glucose-Capillary: 106 mg/dL — ABNORMAL HIGH (ref 70–99)
Glucose-Capillary: 118 mg/dL — ABNORMAL HIGH (ref 70–99)

## 2022-12-08 LAB — APTT: aPTT: 29 seconds (ref 24–36)

## 2022-12-08 LAB — MAGNESIUM: Magnesium: 1.5 mg/dL — ABNORMAL LOW (ref 1.7–2.4)

## 2022-12-08 LAB — CBG MONITORING, ED
Glucose-Capillary: 124 mg/dL — ABNORMAL HIGH (ref 70–99)
Glucose-Capillary: 110 mg/dL — ABNORMAL HIGH (ref 70–99)

## 2022-12-08 MED ORDER — ALLOPURINOL 300 MG PO TABS
300.0000 mg | ORAL_TABLET | Freq: Every day | ORAL | Status: DC
  Administered 2022-12-08: 300 mg via ORAL
  Filled 2022-12-08: qty 1

## 2022-12-08 MED ORDER — HYDROMORPHONE HCL 1 MG/ML IJ SOLN
0.5000 mg | INTRAMUSCULAR | Status: DC | PRN
  Filled 2022-12-08: qty 0.5

## 2022-12-08 MED ORDER — CLONIDINE HCL 0.1 MG PO TABS: 0.3000 mg | ORAL_TABLET | Freq: Three times a day (TID) | ORAL | Status: DC

## 2022-12-08 MED ORDER — HEPARIN (PORCINE) 25000 UT/250ML-% IV SOLN
1300.0000 [IU]/h | INTRAVENOUS | Status: AC
  Administered 2022-12-08: 1300 [IU]/h via INTRAVENOUS
  Filled 2022-12-08: qty 250

## 2022-12-08 MED ORDER — AMLODIPINE BESYLATE 5 MG PO TABS: 10.0000 mg | ORAL_TABLET | Freq: Every day | ORAL | Status: DC

## 2022-12-08 MED ORDER — PRAZOSIN HCL 1 MG PO CAPS: 4.0000 mg | ORAL_CAPSULE | Freq: Every day | ORAL | Status: DC

## 2022-12-08 MED ORDER — CLONIDINE HCL 0.3 MG/24HR TD PTWK
0.3000 mg | MEDICATED_PATCH | TRANSDERMAL | Status: DC
  Administered 2022-12-08: 0.3 mg via TRANSDERMAL
  Filled 2022-12-08: qty 1

## 2022-12-08 MED ORDER — CIPROFLOXACIN IN D5W 200 MG/100ML IV SOLN: 200.0000 mg | Freq: Once | INTRAVENOUS | Status: DC

## 2022-12-08 MED ORDER — MAGNESIUM SULFATE 4 GM/100ML IV SOLN
4.0000 g | Freq: Once | INTRAVENOUS | Status: AC
  Administered 2022-12-08: 4 g via INTRAVENOUS
  Filled 2022-12-08: qty 100

## 2022-12-08 MED ORDER — APIXABAN 5 MG PO TABS
5.0000 mg | ORAL_TABLET | Freq: Two times a day (BID) | ORAL | Status: DC
  Administered 2022-12-08 – 2022-12-09 (×3): 5 mg via ORAL
  Filled 2022-12-08 (×3): qty 1

## 2022-12-08 MED ORDER — METRONIDAZOLE 500 MG/100ML IV SOLN
500.0000 mg | Freq: Two times a day (BID) | INTRAVENOUS | Status: DC
  Administered 2022-12-08 (×2): 500 mg via INTRAVENOUS
  Filled 2022-12-08 (×2): qty 100

## 2022-12-08 MED ORDER — PRAZOSIN HCL 1 MG PO CAPS
4.0000 mg | ORAL_CAPSULE | Freq: Every day | ORAL | Status: DC
  Administered 2022-12-09: 4 mg via ORAL
  Filled 2022-12-08 (×2): qty 4

## 2022-12-08 MED ORDER — ASPIRIN 81 MG PO CHEW
81.0000 mg | CHEWABLE_TABLET | Freq: Every day | ORAL | Status: DC
  Administered 2022-12-08 – 2022-12-09 (×2): 81 mg via ORAL
  Filled 2022-12-08 (×2): qty 1

## 2022-12-08 MED ORDER — FUROSEMIDE 40 MG PO TABS
40.0000 mg | ORAL_TABLET | Freq: Two times a day (BID) | ORAL | Status: DC
  Administered 2022-12-08 – 2022-12-09 (×2): 40 mg via ORAL
  Filled 2022-12-08 (×2): qty 1

## 2022-12-08 MED ORDER — AYR SALINE NASAL NA GEL
1.0000 | Freq: Four times a day (QID) | NASAL | Status: DC
  Administered 2022-12-08 – 2022-12-09 (×2): 1 via NASAL
  Filled 2022-12-08: qty 14.1

## 2022-12-08 MED ORDER — ROSUVASTATIN CALCIUM 10 MG PO TABS
10.0000 mg | ORAL_TABLET | Freq: Every day | ORAL | Status: DC
  Administered 2022-12-08 – 2022-12-09 (×2): 10 mg via ORAL
  Filled 2022-12-08 (×2): qty 1

## 2022-12-08 MED ORDER — SODIUM CHLORIDE 0.9 % IV SOLN
2.0000 g | INTRAVENOUS | Status: DC
  Administered 2022-12-08 – 2022-12-09 (×2): 2 g via INTRAVENOUS
  Filled 2022-12-08 (×2): qty 20

## 2022-12-08 MED ORDER — HEPARIN BOLUS VIA INFUSION
4000.0000 [IU] | Freq: Once | INTRAVENOUS | Status: AC
  Administered 2022-12-08: 4000 [IU] via INTRAVENOUS
  Filled 2022-12-08: qty 4000

## 2022-12-08 MED ORDER — DILTIAZEM HCL 25 MG/5ML IV SOLN
10.0000 mg | Freq: Once | INTRAVENOUS | Status: AC
  Administered 2022-12-08: 10 mg via INTRAVENOUS
  Filled 2022-12-08: qty 5

## 2022-12-08 MED ORDER — METOPROLOL TARTRATE 50 MG PO TABS
50.0000 mg | ORAL_TABLET | Freq: Two times a day (BID) | ORAL | Status: DC
  Administered 2022-12-08 – 2022-12-09 (×3): 50 mg via ORAL
  Filled 2022-12-08 (×2): qty 1
  Filled 2022-12-08: qty 2

## 2022-12-08 MED ORDER — PANTOPRAZOLE SODIUM 40 MG PO TBEC
40.0000 mg | DELAYED_RELEASE_TABLET | Freq: Every day | ORAL | Status: DC
  Administered 2022-12-08 – 2022-12-09 (×2): 40 mg via ORAL
  Filled 2022-12-08 (×2): qty 1

## 2022-12-08 MED ORDER — DUTASTERIDE 0.5 MG PO CAPS
0.5000 mg | ORAL_CAPSULE | Freq: Every day | ORAL | Status: DC
  Administered 2022-12-08: 0.5 mg via ORAL
  Filled 2022-12-08 (×2): qty 1

## 2022-12-08 MED ORDER — MORPHINE SULFATE (PF) 2 MG/ML IV SOLN
2.0000 mg | Freq: Once | INTRAVENOUS | Status: AC
  Administered 2022-12-08: 2 mg via INTRAVENOUS
  Filled 2022-12-08: qty 1

## 2022-12-08 MED ORDER — ALLOPURINOL 100 MG PO TABS
100.0000 mg | ORAL_TABLET | Freq: Every day | ORAL | Status: DC
  Administered 2022-12-09: 100 mg via ORAL
  Filled 2022-12-08: qty 1

## 2022-12-08 NOTE — Discharge Instructions (Signed)

## 2022-12-08 NOTE — ED Notes (Signed)
ED TO INPATIENT HANDOFF REPORT  ED Nurse Name and Phone #: Amie Critchley Name/Age/Gender Karl Robinson 72 y.o. male Room/Bed: WA19/WA19  Code Status   Code Status: Prior  Home/SNF/Other Home Patient oriented to: self, place, time, and situation Is this baseline? Yes   Triage Complete: Triage complete  Chief Complaint Acute diverticulitis [K57.92] Diverticulitis [K57.92]  Triage Note BIB EMS from home for upper abdominal pain for a day that has worsened the last few hours. 7 episodes of vomiting today. Pt is a new dialysis pt and has his first tx this Wednesday.  Pt is writhing in pain, denies pain radiating into back   Allergies Allergies  Allergen Reactions   Spironolactone Other (See Comments)    Gynecomastia   Hydromorphone Hcl     REACTION: UNSPECIFIED   Oxycodone Hcl     REACTION: itch    Level of Care/Admitting Diagnosis ED Disposition     ED Disposition  Admit   Condition  --   Comment  Hospital Area: [facility] [[id number]]  Level of Care: Telemetry [5]  Admit to tele based on following criteria: Complex arrhythmia (Bradycardia/Tachycardia)  May admit patient to [facility] or [facility] if equivalent level of care is available:: No  Covid Evaluation: Asymptomatic - no recent exposure (last 10 days) testing not required  Diagnosis: Diverticulitis [132440]  Admitting Physician: Jerald Kief [[id number]]  Attending Physician: Jerald Kief [phone]  Certification:: I certify this patient will need inpatient services for at least 2 midnights          B Medical/Surgery History Past Medical History:  Diagnosis Date   Adrenal hyperplasia (HCC)    Anemia, unspecified    Arthropathy, unspecified, site unspecified    Benign hypertension, endocrine    Cervical stenosis of spine    Changes in vascular appearance of retina    CHF (congestive heart failure) (HCC)    CKD (chronic kidney disease) stage 3, GFR 30-59 ml/min (HCC)     followed by [facility] (Dr. Patton Salles)   Diverticulosis of colon (without mention of hemorrhage)    Esophageal reflux    Gout    HLD (hyperlipidemia)    Hyperaldosteronism, unspecified (HCC)    Hypertrophy of prostate without urinary obstruction and other lower urinary tract symptoms (LUTS)    Mixed hearing loss, unspecified    Obesity    Ruptured lumbar disc    L5-S1   Type II or unspecified type diabetes mellitus without mention of complication, not stated as uncontrolled    Unspecified vitamin D deficiency    Varicocele    Left   Past Surgical History:  Procedure Laterality Date   Bone Scan  11/16/07   Normal   CARDIAC CATHETERIZATION  12/14/00   Dr. Leota Jacobsen   CARDIAC CATHETERIZATION  1989   Dr. Sena Slate   CARDIOVASCULAR STRESS TEST  10/21/05   WNL, but had EKG changes   Cervical MRI  04/16/03   Stenosis of C4/5,C5/6 with Narrowing   EMG/NCS  05/16/03   RVL Normal, Borderline RCTS   ESOPHAGOGASTRODUODENOSCOPY  11/98   Gastritis; Duod ulcer   ESOPHAGOGASTRODUODENOSCOPY  05/06/01   Dr. Arty Baumgartner Mucosa Bx-Negative   FLEXIBLE SIGMOIDOSCOPY  07/18/98   Dr. Woodroe Mode   Lumbosacral MRI  11/16/07   L5-S1 Disc Rupture   NECK SURGERY  10/26/03   Dr. Otelia Sergeant   Pelvic MRI  11/16/07   Normal   Renal Arterial US  08/10/03   Negative   ROTATOR  CUFF REPAIR Right 01/17/04   Testicular US  09/07/00   Left-sided varicocele (small)   TYMPANIC MEMBRANE REPAIR Right 1972   Prosthesis   US ECHOCARDIOGRAPHY  12/07/00   EF 70 %     A IV Location/Drains/Wounds Patient Lines/Drains/Airways Status     Active Line/Drains/Airways     Name Placement date Placement time Site Days   Peripheral IV 12/08/22 20 G Anterior;Left;Proximal Forearm 12/08/22  0233  Forearm  less than 1   Peripheral IV 12/08/22 20 G Left;Posterior Hand 12/08/22  1217  Hand  less than 1   Fistula / Graft Right Upper arm Arteriovenous fistula 09/29/22  2030  Upper arm  70             Intake/Output Last 24 hours  Intake/Output Summary (Last 24 hours) at 12/08/2022 1442 Last data filed at 12/08/2022 0043 Gross per 24 hour  Intake 732.9 ml  Output --  Net 732.9 ml    Labs/Imaging Results for orders placed or performed during the hospital encounter of 12/07/22 (from the past 48 hour(s))  CBC with Differential     Status: Abnormal   Collection Time: 12/07/22  9:24 PM  Result Value Ref Range   WBC 7.0 4.0 - 10.5 K/uL   RBC 4.82 4.22 - 5.81 MIL/uL   Hemoglobin 12.9 (L) 13.0 - 17.0 g/dL   HCT 29.5 (L) 62.1 - 30.8 %   MCV 79.7 (L) 80.0 - 100.0 fL   MCH 26.8 26.0 - 34.0 pg   MCHC 33.6 30.0 - 36.0 g/dL   RDW 65.7 84.6 - 96.2 %   Platelets 203 150 - 400 K/uL   nRBC 0.0 0.0 - 0.2 %   Neutrophils Relative % 89 %   Neutro Abs 6.2 1.7 - 7.7 K/uL   Lymphocytes Relative 9 %   Lymphs Abs 0.6 (L) 0.7 - 4.0 K/uL   Monocytes Relative 2 %   Monocytes Absolute 0.2 0.1 - 1.0 K/uL   Eosinophils Relative 0 %   Eosinophils Absolute 0.0 0.0 - 0.5 K/uL   Basophils Relative 0 %   Basophils Absolute 0.0 0.0 - 0.1 K/uL   Immature Granulocytes 0 %   Abs Immature Granulocytes 0.02 0.00 - 0.07 K/uL    Comment: Performed at [facility], [address] [place], [place] [address]  Comprehensive metabolic panel     Status: Abnormal   Collection Time: 12/07/22  9:24 PM  Result Value Ref Range   Sodium 140 135 - 145 mmol/L   Potassium 3.3 (L) 3.5 - 5.1 mmol/L   Chloride 104 98 - 111 mmol/L   CO2 20 (L) 22 - 32 mmol/L   Glucose, Bld 156 (H) 70 - 99 mg/dL    Comment: Glucose reference range applies only to samples taken after fasting for at least 8 hours.   BUN 41 (H) 8 - 23 mg/dL   Creatinine, Ser 1.32 (H) 0.61 - 1.24 mg/dL   Calcium 9.2 8.9 - 44.0 mg/dL   Total Protein 6.9 6.5 - 8.1 g/dL   Albumin 3.8 3.5 - 5.0 g/dL   AST 27 15 - 41 U/L   ALT 15 0 - 44 U/L   Alkaline Phosphatase 106 38 - 126 U/L   Total Bilirubin 0.9 0.3 - 1.2 mg/dL   GFR, Estimated 12 (L)  >60 mL/min    Comment: (NOTE) Calculated using the CKD-EPI Creatinine Equation (2021)    Anion gap 16 (H) 5 - 15    Comment: Performed  at [facility], [address], [place], [place] [address]  Lipase, blood     Status: Abnormal   Collection Time: 12/07/22  9:24 PM  Result Value Ref Range   Lipase 55 (H) 11 - 51 U/L    Comment: Performed at [facility], [address], [place], [place] [address]  Troponin I (High Sensitivity)     Status: Abnormal   Collection Time: 12/07/22  9:24 PM  Result Value Ref Range   Troponin I (High Sensitivity) 51 (H) <18 ng/L    Comment: (NOTE) Elevated high sensitivity troponin I (hsTnI) values and significant  changes across serial measurements may suggest ACS but many other  chronic and acute conditions are known to elevate hsTnI results.  Refer to the "Links" section for chest pain algorithms and additional  guidance. Performed at [facility], [address], [place], [place] [address]   Brain natriuretic peptide     Status: Abnormal   Collection Time: 12/07/22  9:24 PM  Result Value Ref Range   B Natriuretic Peptide 516.4 (H) 0.0 - 100.0 pg/mL    Comment: Performed at [facility], [address], [place], [place] [address]  Urinalysis, Routine w reflex microscopic -Urine, Clean Catch     Status: Abnormal   Collection Time: 12/07/22 11:10 PM  Result Value Ref Range   Color, Urine YELLOW YELLOW   APPearance CLEAR CLEAR   Specific Gravity, Urine 1.011 1.005 - 1.030   pH 8.0 5.0 - 8.0   Glucose, UA 50 (A) NEGATIVE mg/dL   Hgb urine dipstick NEGATIVE NEGATIVE   Bilirubin Urine NEGATIVE NEGATIVE   Ketones, ur NEGATIVE NEGATIVE mg/dL   Protein, ur >=578 (A) NEGATIVE mg/dL   Nitrite NEGATIVE NEGATIVE   Leukocytes,Ua NEGATIVE NEGATIVE   RBC / HPF 0-5 0 - 5 RBC/hpf   WBC, UA 0-5 0 - 5 WBC/hpf   Bacteria, UA NONE SEEN NONE SEEN   Squamous Epithelial / HPF 0-5 0 - 5 /HPF     Comment: Performed at [facility], [address], [place], [place] [address]  Troponin I (High Sensitivity)     Status: Abnormal   Collection Time: 12/07/22 11:20 PM  Result Value Ref Range   Troponin I (High Sensitivity) 74 (H) <18 ng/L    Comment: RESULT CALLED TO, READ BACK BY AND VERIFIED WITH RIMANDO,J RN @0019  12/08/22 BY CHILDRESS,E (NOTE) Elevated high sensitivity troponin I (hsTnI) values and significant  changes across serial measurements may suggest ACS but many other  chronic and acute conditions are known to elevate hsTnI results.  Refer to the "Links" section for chest pain algorithms and additional  guidance. Performed at [facility], [address] [place], [place] [address]   Hemoglobin A1c     Status: None   Collection Time: 12/08/22  2:33 AM  Result Value Ref Range   Hgb A1c MFr Bld 5.3 4.8 - 5.6 %    Comment: (NOTE) Pre diabetes:          5.7%-6.4%  Diabetes:              >6.4%  Glycemic control for   <7.0% adults with diabetes    Mean Plasma Glucose 105.41 mg/dL    Comment: Performed at [facility], [address], [place], [place] [address]  TSH     Status: Abnormal   Collection Time: 12/08/22  6:33 AM  Result Value Ref Range  TSH 0.292 (L) 0.350 - 4.500 uIU/mL    Comment: Performed by a 3rd Generation assay with a functional sensitivity of <=0.01 uIU/mL. Performed at [facility], [address] [place], [place] [address]   APTT     Status: None   Collection Time: 12/08/22  6:33 AM  Result Value Ref Range   aPTT 29 24 - 36 seconds    Comment: Performed at [facility], [address] [place], [place] [address]  Protime-INR     Status: None   Collection Time: 12/08/22  6:33 AM  Result Value Ref Range   Prothrombin Time 14.6 11.4 - 15.2 seconds   INR 1.1 0.8 - 1.2    Comment: (NOTE) INR goal varies based on device and disease states. Performed at [facility], [address], [place], [place] [address]   Magnesium     Status: Abnormal   Collection Time: 12/08/22  6:33 AM  Result Value Ref Range   Magnesium 1.5 (L) 1.7 - 2.4 mg/dL    Comment: Performed at [facility], [address] [place], [place] [address]  CBG monitoring, ED     Status: Abnormal   Collection Time: 12/08/22  7:58 AM  Result Value Ref Range   Glucose-Capillary 124 (H) 70 - 99 mg/dL    Comment: Glucose reference range applies only to samples taken after fasting for at least 8 hours.  CBG monitoring, ED     Status: Abnormal   Collection Time: 12/08/22 11:50 AM  Result Value Ref Range   Glucose-Capillary 110 (H) 70 - 99 mg/dL    Comment: Glucose reference range applies only to samples taken after fasting for at least 8 hours.   CT ABDOMEN PELVIS WO CONTRAST  Result Date: 12/07/2022 CLINICAL DATA:  Abdominal pain.  Seven episodes of vomiting. EXAM: CT ABDOMEN AND PELVIS WITHOUT CONTRAST TECHNIQUE: Multidetector CT imaging of the abdomen and pelvis was performed following the standard protocol without IV contrast. RADIATION DOSE REDUCTION: This exam was performed according to the departmental dose-optimization program which includes automated exposure control, adjustment of the mA and/or kV according to patient size and/or use of iterative reconstruction technique. COMPARISON:  None Available. FINDINGS: Lower chest: Small pericardial effusion. Hepatobiliary: Tiny hypodensity in the superior left hepatic lobe is too small to definitively characterize but likely represents a benign cyst or hemangioma. Unremarkable gallbladder and biliary tree. Pancreas: Unremarkable. Spleen: Unremarkable. Adrenals/Urinary Tract: Normal adrenal glands. Nonobstructing bilateral nephrolithiasis. No ureteral calculi or hydronephrosis. Diffuse bladder wall thickening. Stomach/Bowel: Normal caliber large and small bowel. Extensive sigmoid diverticulosis with subtle  stranding about a few sigmoid diverticula. No appendicitis. Stomach is within normal limits. Vascular/Lymphatic: Aortic atherosclerosis. No enlarged abdominal or pelvic lymph nodes. Reproductive: Enlarged prostate. Other: Small volume low-density free fluid in the pelvis. No free intraperitoneal air. Musculoskeletal: No acute osseous abnormality. IMPRESSION: 1. Mild uncomplicated sigmoid diverticulitis. 2. Nonobstructing bilateral nephrolithiasis. 3. Diffuse bladder wall thickening, possibly reflecting chronic outlet obstruction given the enlarged prostate. 4. Small volume free fluid in the pelvis. Aortic Atherosclerosis (ICD10-I70.0). Electronically Signed   By: Minerva Fester M.D.   On: 12/07/2022 22:41   DG Chest Port 1 View  Result Date: 12/07/2022 CLINICAL DATA:  Shortness of breath EXAM: PORTABLE CHEST 1 VIEW COMPARISON:  09/28/2022 FINDINGS: The heart size and mediastinal contours are within normal limits. Aortic atherosclerosis. Both lungs are clear. The visualized skeletal structures are unremarkable. IMPRESSION: No active disease. Electronically Signed   By:  Jasmine Pang M.D.   On: 12/07/2022 21:50    Pending Labs Unresulted Labs (From admission, onward)     Start     Ordered   12/09/22 1430  CBC  Daily,   R      12/08/22 0616   12/08/22 0220  Gastrointestinal Panel by PCR , Stool  (Gastrointestinal Panel by PCR, Stool                                                                                                                                                     **Does Not include CLOSTRIDIUM DIFFICILE testing. **If CDIFF testing is needed, place order from the "C Difficile Testing" order set.**)  Once,   R        12/08/22 0219   12/08/22 0219  C Difficile Quick Screen w PCR reflex  (C Difficile quick screen w PCR reflex panel )  Once, for 24 hours,   TIMED       References:    CDiff Information Tool   12/08/22 0219            Vitals/Pain Today's Vitals   12/08/22 0943  12/08/22 1230 12/08/22 1303 12/08/22 1315  BP: (!) 180/80 (!) 167/77 (!) 171/95   Pulse: 81 75 77   Resp: 18  20   Temp:    98.6 F (37 C)  TempSrc:   Oral Oral  SpO2: 100% 97% 99%   Weight:      Height:      PainSc:        Isolation Precautions Enteric precautions (UV disinfection)  Medications Medications  ondansetron (ZOFRAN) injection 4 mg (has no administration in time range)  insulin aspart (novoLOG) injection 0-15 Units ( Subcutaneous Not Given 12/08/22 1229)  insulin aspart (novoLOG) injection 0-5 Units ( Subcutaneous Not Given 12/08/22 0010)  metoprolol tartrate (LOPRESSOR) injection 5 mg (5 mg Intravenous Given 12/08/22 0254)  aspirin chewable tablet 81 mg (81 mg Oral Given 12/08/22 1228)  pantoprazole (PROTONIX) EC tablet 40 mg (40 mg Oral Given 12/08/22 1227)  rosuvastatin (CRESTOR) tablet 10 mg (has no administration in time range)  furosemide (LASIX) tablet 40 mg (has no administration in time range)  dutasteride (AVODART) capsule 0.5 mg (has no administration in time range)  cefTRIAXone (ROCEPHIN) 2 g in sodium chloride 0.9 % 100 mL IVPB (0 g Intravenous Stopped 12/08/22 0333)  metroNIDAZOLE (FLAGYL) IVPB 500 mg (has no administration in time range)  prazosin (MINIPRESS) capsule 4 mg (has no administration in time range)  metoprolol tartrate (LOPRESSOR) tablet 50 mg (50 mg Oral Given 12/08/22 1228)  heparin bolus via infusion 4,000 Units (4,000 Units Intravenous Bolus from Bag 12/08/22 0648)    Followed by  heparin ADULT infusion 100 units/mL (25000 units/219mL) (0 Units/hr Intravenous Stopped 12/08/22 1352)  saline (AYR) nasal gel with aloe 1  Application (has no administration in time range)  cloNIDine (CATAPRES - Dosed in mg/24 hr) patch 0.3 mg (has no administration in time range)  apixaban (ELIQUIS) tablet 5 mg (has no administration in time range)  HYDROmorphone (DILAUDID) injection 0.5 mg (has no administration in time range)  allopurinol (ZYLOPRIM) tablet 100 mg  (has no administration in time range)  ondansetron (ZOFRAN) injection 4 mg (4 mg Intravenous Given 12/07/22 2139)  morphine (PF) 4 MG/ML injection 6 mg (6 mg Intravenous Given 12/07/22 2135)  LORazepam (ATIVAN) injection 0.5 mg (0.5 mg Intravenous Given 12/07/22 2139)  pantoprazole (PROTONIX) injection 40 mg (40 mg Intravenous Given 12/07/22 2139)  sodium chloride 0.9 % bolus 1,000 mL (0 mLs Intravenous Stopped 12/07/22 2205)  metoCLOPramide (REGLAN) injection 10 mg (10 mg Intravenous Given 12/07/22 2310)  morphine (PF) 4 MG/ML injection 4 mg (4 mg Intravenous Given 12/07/22 2311)  diltiazem (CARDIZEM) injection 10 mg (10 mg Intravenous Given 12/07/22 2311)  ciprofloxacin (CIPRO) IVPB 400 mg (0 mg Intravenous Stopped 12/08/22 0043)  metroNIDAZOLE (FLAGYL) IVPB 500 mg (0 mg Intravenous Stopped 12/08/22 0043)  metoprolol tartrate (LOPRESSOR) injection 5 mg (5 mg Intravenous Given 12/07/22 2310)  morphine (PF) 2 MG/ML injection 2 mg (2 mg Intravenous Given 12/08/22 0231)  diltiazem (CARDIZEM) injection 10 mg (10 mg Intravenous Given 12/08/22 0627)  magnesium sulfate IVPB 4 g 100 mL (4 g Intravenous New Bag/Given 12/08/22 1227)    Mobility walks     Focused Assessments    R Recommendations: See Admitting Provider Note  Report given to:   Additional Notes:

## 2022-12-08 NOTE — Progress Notes (Addendum)
  Progress Note   Patient: Karl Robinson ZOX:[id number] DOB: 06/28/1951 DOA: 12/07/2022     0 DOS: the patient was seen and examined on 12/08/2022   Brief hospital course: 72 y/o male with past medical history of HFpEF (EF > 55% in 02/2021), HTN, HLD, T2DM, CKD5, OSA (occasionally on CPAP, has not used in 2-3 months, causes nightmares with PTSD), GERD, BPH, DDD, PTSD, insomnia, adrenal hyperplasia, hyperaldosteronism, obesity, diverticulosis, gout, cervical stenosis, vitamin D deficiency, anemia, and mixed hearing loss  Presents to [employer] after he was started having abdominal pain the day prior to admit  Pt was found to have evidence of mild diverticulitis on CT abd. Pt was unable to tolerate PO while in ED, thus was admitted for further workup. Pt was also found to have new afib  Assessment and Plan: Acute diverticulitis -Pt reportedly was unable to tolerate PO while in ED secondary to increased pain and discomfort -Pt had been continued on rocephin and flagyl -This AM, tolerated clears better. Diet is being advanced, however towards end of day, pt remained on a mostly liquid diet -No on soft diet -Cont analgesia as needed   Atrial fibrillation with RVR -New diagnosis -Now on metoprolol 50 mg p.o. twice daily.  Norvasc discontinued -2D echo reviewed. Normal LVEF, no WMA, normal RV function and size -[place] score 4, now on eliquis    Chronic kidney disease, stage 5 (severe) (HCC) -Pt is scheduled to start outpatient HD at noon on 5/22 -Hopefully pt would  be able to discharge soon in order to present to scheduled outpt HD    Hyperlipidemia -Patient Crestor continued    Type 2 diabetes mellitus with chronic kidney disease (HCC) -Sliding scale insulin while in hospital    Gout -Continue allopurinol, renally dosed  Hypokalemia -Replaced  Hypomagnesemia -Replaced   Subjective: This AM, reports feeling much better. Tolerating clears this AM  Physical Exam: Vitals:    12/08/22 0943 12/08/22 1230 12/08/22 1303 12/08/22 1315  BP: (!) 180/80 (!) 167/77 (!) 171/95   Pulse: 81 75 77   Resp: 18  20   Temp:    98.6 F (37 C)  TempSrc:   Oral Oral  SpO2: 100% 97% 99%   Weight:      Height:       General exam: Awake, laying in bed, in nad Respiratory system: Normal respiratory effort, no wheezing Cardiovascular system: regular rate, s1, s2 Gastrointestinal system: Soft, nondistended, positive BS Central nervous system: CN2-12 grossly intact, strength intact Extremities: Perfused, no clubbing Skin: Normal skin turgor, no notable skin lesions seen Psychiatry: Mood normal // no visual hallucinations   Data Reviewed:  Labs reviewed: Na 140, K 3.3, Cr 4.91, hgb 12.9  Family Communication: Pt in room, family at bedside  Disposition: Status is: Inpatient Remains inpatient appropriate because: Severity of illness  Planned Discharge Destination: Home     Author: Rickey Barbara, MD 12/08/2022 4:35 PM  For on call review [email or web address].

## 2022-12-08 NOTE — Hospital Course (Signed)
72 y/o male with past medical history of HFpEF (EF > 55% in 02/2021), HTN, HLD, T2DM, CKD5, OSA (occasionally on CPAP, has not used in 2-3 months, causes nightmares with PTSD), GERD, BPH, DDD, PTSD, insomnia, adrenal hyperplasia, hyperaldosteronism, obesity, diverticulosis, gout, cervical stenosis, vitamin D deficiency, anemia, and mixed hearing loss  Presents to [employer] after he was started having abdominal pain the day prior to admit  Pt was found to have evidence of mild diverticulitis on CT abd. Pt was unable to tolerate PO while in ED, thus was admitted for further workup. Pt was also found to have new afib

## 2022-12-08 NOTE — H&P (Addendum)
PCP:   [facility]   Chief Complaint:  Abdominal pain  HPI: This is a 72 y/o male with past medical history of HFpEF (EF > 55% in 02/2021), HTN, HLD, T2DM, CKD5, OSA (occasionally on CPAP, has not used in 2-3 months, causes nightmares with PTSD), GERD, BPH, DDD, PTSD, insomnia, adrenal hyperplasia, hyperaldosteronism, obesity, diverticulosis, gout, cervical stenosis, vitamin D deficiency, anemia, and mixed hearing loss  Presents to [facility] after he was started having abdominal pain yesterday.  The pain gradually became worse and worse.  He developed nausea, vomiting and diarrhea.  He had diarrhea all day yesterday.  His last emesis was 10 PM last night.  He endorses both fevers and chills.  He came to the ER.   In the ER patient found to have A-fib with RVR, new diagnosis.  He was given 10 mg IV Cardizem heart rate decreased from 140 to the 90s.  CT abdomen and pelvis shows uncomplicated acute diverticulitis.   Review of Systems:  Per HPI  Past Medical History: Past Medical History:  Diagnosis Date   Adrenal hyperplasia (HCC)    Anemia, unspecified    Arthropathy, unspecified, site unspecified    Benign hypertension, endocrine    Cervical stenosis of spine    Changes in vascular appearance of retina    CHF (congestive heart failure) (HCC)    CKD (chronic kidney disease) stage 3, GFR 30-59 ml/min (HCC)    followed by [facility] (Dr. Patton Salles)   Diverticulosis of colon (without mention of hemorrhage)    Esophageal reflux    Gout    HLD (hyperlipidemia)    Hyperaldosteronism, unspecified (HCC)    Hypertrophy of prostate without urinary obstruction and other lower urinary tract symptoms (LUTS)    Mixed hearing loss, unspecified    Obesity    Ruptured lumbar disc    L5-S1   Type II or unspecified type diabetes mellitus without mention of complication, not stated as uncontrolled    Unspecified vitamin D deficiency    Varicocele    Left   Past Surgical History:  Procedure  Laterality Date   Bone Scan  11/16/07   Normal   CARDIAC CATHETERIZATION  12/14/00   Dr. Leota Jacobsen   CARDIAC CATHETERIZATION  1989   Dr. Sena Slate   CARDIOVASCULAR STRESS TEST  10/21/05   WNL, but had EKG changes   Cervical MRI  04/16/03   Stenosis of C4/5,C5/6 with Narrowing   EMG/NCS  05/16/03   RVL Normal, Borderline RCTS   ESOPHAGOGASTRODUODENOSCOPY  11/98   Gastritis; Duod ulcer   ESOPHAGOGASTRODUODENOSCOPY  05/06/01   Dr. Arty Baumgartner Mucosa Bx-Negative   FLEXIBLE SIGMOIDOSCOPY  07/18/98   Dr. Woodroe Mode   Lumbosacral MRI  11/16/07   L5-S1 Disc Rupture   NECK SURGERY  10/26/03   Dr. Otelia Sergeant   Pelvic MRI  11/16/07   Normal   Renal Arterial US  08/10/03   Negative   ROTATOR CUFF REPAIR Right 01/17/04   Testicular US  09/07/00   Left-sided varicocele (small)   TYMPANIC MEMBRANE REPAIR Right 1972   Prosthesis   US ECHOCARDIOGRAPHY  12/07/00   EF 70 %    Medications: Prior to Admission medications   Medication Sig Start Date End Date Taking? Authorizing Provider  acetaminophen (TYLENOL) 500 MG tablet Take 500 mg by mouth 2 (two) times daily.    [provider]  allopurinol (ZYLOPRIM) 300 MG tablet Take 300 mg by mouth daily.    [provider]  amLODipine (NORVASC)  10 MG tablet Take 10 mg by mouth daily.    [provider]  aspirin 81 MG tablet Take 81 mg by mouth daily.    [provider]  Cholecalciferol (VITAMIN D3 PO) Take 2,000 Units by mouth daily.    [provider]  cloNIDine (CATAPRES) 0.3 MG tablet Take 0.3 mg by mouth 3 (three) times daily.    [provider]  dutasteride (AVODART) 0.5 MG capsule Take 0.5 mg by mouth daily.    [provider]  ferrous sulfate 325 (65 FE) MG tablet Take 325 mg by mouth every Monday, Wednesday, and Friday.    [provider]  furosemide (LASIX) 40 MG tablet Take 1 tablet (40 mg total) by mouth 2 (two) times daily. 10/01/22 10/31/22  Rana Snare, DO   Multiple Vitamin (MULTIVITAMIN PO) Take 1 tablet by mouth in the morning.    [provider]  pantoprazole (PROTONIX) 40 MG tablet Take 1 tablet (40 mg total) by mouth daily. 10/02/22   Rana Snare, DO  potassium chloride SA (KLOR-CON M) 20 MEQ tablet Take 20 mEq by mouth 2 (two) times daily.    [provider]  prazosin (MINIPRESS) 2 MG capsule Take 4 mg by mouth at bedtime.    [provider]  rosuvastatin (CRESTOR) 10 MG tablet Take 1 tablet (10 mg total) by mouth daily. 10/02/22   Rana Snare, DO    Allergies:   Allergies  Allergen Reactions   Hydromorphone Hcl     REACTION: UNSPECIFIED   Oxycodone Hcl     REACTION: itch    Social History:  reports that he has never smoked. He has never used smokeless tobacco. He reports that he does not drink alcohol and does not use drugs.  Family History: Family History  Problem Relation Age of Onset   Cirrhosis Father        ETOH   Hypertension Mother        Kidney transplant   Congestive Heart Failure Mother    Hypertension Brother    Drug abuse Brother    Stroke Maternal Grandmother     Physical Exam: Vitals:   12/07/22 2145 12/07/22 2243 12/07/22 2345 12/08/22 0000  BP: (!) 219/67  (!) 194/96 (!) 205/81  Pulse: 98 92 95 (!) 117  Resp: 19 18 17  (!) 21  Temp:      TempSrc:      SpO2: 100% 100% 100% 98%  Weight:      Height:        General:  Alert and oriented times three, well developed and nourished, no acute distress Eyes: PERRLA, pink conjunctiva, no scleral icterus ENT: Moist oral mucosa, neck supple, no thyromegaly Lungs: clear to ascultation, no wheeze, no crackles, no use of accessory muscles Cardiovascular: tachycardia, irregular rate and rhythm, no regurgitation, no gallops, no murmurs. No carotid bruits, no JVD Abdomen: soft, positive BS,+TTP generalized, non specific, non-distended, no organomegaly, not an acute abdomen GU: not examined Neuro: CN II - XII grossly intact,  sensation intact Musculoskeletal: strength 5/5 all extremities, no clubbing, cyanosis or edema Skin: no rash, no subcutaneous crepitation, no decubitus Psych: appropriate patient   Labs on Admission:  Recent Labs    12/07/22 2124  NA 140  K 3.3*  CL 104  CO2 20*  GLUCOSE 156*  BUN 41*  CREATININE 4.91*  CALCIUM 9.2   Recent Labs    12/07/22 2124  AST 27  ALT 15  ALKPHOS 106  BILITOT 0.9  PROT 6.9  ALBUMIN 3.8   Recent Labs    12/07/22 2124  LIPASE 55*   Recent Labs    12/07/22 2124  WBC 7.0  NEUTROABS 6.2  HGB 12.9*  HCT 38.4*  MCV 79.7*  PLT 203     Radiological Exams on Admission: CT ABDOMEN PELVIS WO CONTRAST  Result Date: 12/07/2022 CLINICAL DATA:  Abdominal pain.  Seven episodes of vomiting. EXAM: CT ABDOMEN AND PELVIS WITHOUT CONTRAST TECHNIQUE: Multidetector CT imaging of the abdomen and pelvis was performed following the standard protocol without IV contrast. RADIATION DOSE REDUCTION: This exam was performed according to the departmental dose-optimization program which includes automated exposure control, adjustment of the mA and/or kV according to patient size and/or use of iterative reconstruction technique. COMPARISON:  None Available. FINDINGS: Lower chest: Small pericardial effusion. Hepatobiliary: Tiny hypodensity in the superior left hepatic lobe is too small to definitively characterize but likely represents a benign cyst or hemangioma. Unremarkable gallbladder and biliary tree. Pancreas: Unremarkable. Spleen: Unremarkable. Adrenals/Urinary Tract: Normal adrenal glands. Nonobstructing bilateral nephrolithiasis. No ureteral calculi or hydronephrosis. Diffuse bladder wall thickening. Stomach/Bowel: Normal caliber large and small bowel. Extensive sigmoid diverticulosis with subtle stranding about a few sigmoid diverticula. No appendicitis. Stomach is within normal limits. Vascular/Lymphatic: Aortic atherosclerosis. No enlarged abdominal or pelvic lymph  nodes. Reproductive: Enlarged prostate. Other: Small volume low-density free fluid in the pelvis. No free intraperitoneal air. Musculoskeletal: No acute osseous abnormality. IMPRESSION: 1. Mild uncomplicated sigmoid diverticulitis. 2. Nonobstructing bilateral nephrolithiasis. 3. Diffuse bladder wall thickening, possibly reflecting chronic outlet obstruction given the enlarged prostate. 4. Small volume free fluid in the pelvis. Aortic Atherosclerosis (ICD10-I70.0). Electronically Signed   By: Minerva Fester M.D.   On: 12/07/2022 22:41   DG Chest Port 1 View  Result Date: 12/07/2022 CLINICAL DATA:  Shortness of breath EXAM: PORTABLE CHEST 1 VIEW COMPARISON:  09/28/2022 FINDINGS: The heart size and mediastinal contours are within normal limits. Aortic atherosclerosis. Both lungs are clear. The visualized skeletal structures are unremarkable. IMPRESSION: No active disease. Electronically Signed   By: Jasmine Pang M.D.   On: 12/07/2022 21:50    Assessment/Plan Present on Admission: Acute diverticulitis -Clear liquid diet -IV Rocephin and Flagyl ordered -As needed pain medication  Atrial fibrillation with RVR -New diagnosis -Start metoprolol 50 mg p.o. twice daily.  Norvasc discontinued -2D echo and TSH ordered -[place] score 4, start anticoagulation, heparin for now   Chronic kidney disease, stage 4 (severe) (HCC) -Plan to start HD next week -Transferred to [facility] in the event patient needs hemodialysis   Hyperlipidemia -Patient Crestor continued   Type 2 diabetes mellitus with chronic kidney disease (HCC) -Sliding scale insulin   Gout -Continue allopurinol    Rathana Viveros 12/08/2022, 12:47 AM

## 2022-12-08 NOTE — Progress Notes (Signed)
ANTICOAGULATION CONSULT NOTE - Initial Consult  Pharmacy Consult for Heparin Indication: atrial fibrillation  Allergies  Allergen Reactions   Hydromorphone Hcl     REACTION: UNSPECIFIED   Oxycodone Hcl     REACTION: itch    Patient Measurements: Height: 5\' 9"  (175.3 cm) Weight: 87.2 kg (192 lb 3.9 oz) IBW/kg (Calculated) : 70.7 Heparin Dosing Weight: total body weight  Vital Signs: Temp: 99.4 F (37.4 C) (05/21 0515) Temp Source: Oral (05/21 0515) BP: 209/92 (05/21 0230) Pulse Rate: 110 (05/21 0230)  Labs: Recent Labs    12/07/22 2124 12/07/22 2320  HGB 12.9*  --   HCT 38.4*  --   PLT 203  --   CREATININE 4.91*  --   TROPONINIHS 51* 74*    Estimated Creatinine Clearance: 14.9 mL/min (A) (by C-G formula based on SCr of 4.91 mg/dL (H)).   Medical History: Past Medical History:  Diagnosis Date   Adrenal hyperplasia (HCC)    Anemia, unspecified    Arthropathy, unspecified, site unspecified    Benign hypertension, endocrine    Cervical stenosis of spine    Changes in vascular appearance of retina    CHF (congestive heart failure) (HCC)    CKD (chronic kidney disease) stage 3, GFR 30-59 ml/min (HCC)    followed by [facility] (Dr. Patton Salles)   Diverticulosis of colon (without mention of hemorrhage)    Esophageal reflux    Gout    HLD (hyperlipidemia)    Hyperaldosteronism, unspecified (HCC)    Hypertrophy of prostate without urinary obstruction and other lower urinary tract symptoms (LUTS)    Mixed hearing loss, unspecified    Obesity    Ruptured lumbar disc    L5-S1   Type II or unspecified type diabetes mellitus without mention of complication, not stated as uncontrolled    Unspecified vitamin D deficiency    Varicocele    Left    Medications:  No oral anticoagulation PTA  Assessment: 72 yr male admitted with acute diverticulitis.  In ED found with new AFib with RVR.  PMH significant for CKD (plans to begin HD this week), DM, Gout No oral anticoagulation  PTA  Goal of Therapy:  Heparin level 0.3-0.7 units/ml Monitor platelets by anticoagulation protocol: Yes   Plan:  Baseline aPTT and PT/INR Heparin 4000 unit IV bolus x 1 followed by heparin gtt @ 1300 units/hr Check heparin level 8 hr after heparin gtt started Daily HL and CBC  Itzabella Sorrels, Joselyn Glassman, PharmD 12/08/2022,6:04 AM

## 2022-12-08 NOTE — Progress Notes (Signed)
ANTICOAGULATION CONSULT NOTE  Pharmacy Consult for Eliquis Indication: atrial fibrillation  Allergies  Allergen Reactions   Spironolactone Other (See Comments)    Gynecomastia   Hydromorphone Hcl     REACTION: UNSPECIFIED   Oxycodone Hcl     REACTION: itch    Patient Measurements: Height: 5\' 9"  (175.3 cm) Weight: 87.2 kg (192 lb 3.9 oz) IBW/kg (Calculated) : 70.7  Vital Signs: Temp: 98.6 F (37 C) (05/21 1315) Temp Source: Oral (05/21 1315) BP: 171/95 (05/21 1303) Pulse Rate: 77 (05/21 1303)  Labs: Recent Labs    12/07/22 2124 12/07/22 2320 12/08/22 0633  HGB 12.9*  --   --   HCT 38.4*  --   --   PLT 203  --   --   APTT  --   --  29  LABPROT  --   --  14.6  INR  --   --  1.1  CREATININE 4.91*  --   --   TROPONINIHS 51* 74*  --      Estimated Creatinine Clearance: 14.9 mL/min (A) (by C-G formula based on SCr of 4.91 mg/dL (H)).   Medical History: Past Medical History:  Diagnosis Date   Adrenal hyperplasia (HCC)    Anemia, unspecified    Arthropathy, unspecified, site unspecified    Benign hypertension, endocrine    Cervical stenosis of spine    Changes in vascular appearance of retina    CHF (congestive heart failure) (HCC)    CKD (chronic kidney disease) stage 3, GFR 30-59 ml/min (HCC)    followed by [facility] (Dr. Patton Salles)   Diverticulosis of colon (without mention of hemorrhage)    Esophageal reflux    Gout    HLD (hyperlipidemia)    Hyperaldosteronism, unspecified (HCC)    Hypertrophy of prostate without urinary obstruction and other lower urinary tract symptoms (LUTS)    Mixed hearing loss, unspecified    Obesity    Ruptured lumbar disc    L5-S1   Type II or unspecified type diabetes mellitus without mention of complication, not stated as uncontrolled    Unspecified vitamin D deficiency    Varicocele    Left    Medications:  No oral anticoagulation PTA  Assessment: 72 yr male admitted with acute diverticulitis. In ED found with new AFib  with RVR. PMH significant for CKD (plans to begin HD this week), DM, Gout No oral anticoagulation PTA Patient initially started on heparin infusion, now to transition to Eliquis    Plan:  -Stop heparin infusion -Start Eliquis 5 mg BID -Patient will be counseled prior to discharge  Pricilla Riffle, PharmD, BCPS Clinical Pharmacist 12/08/2022 1:54 PM

## 2022-12-08 NOTE — Plan of Care (Signed)
  Problem: Metabolic: Goal: Ability to maintain appropriate glucose levels will improve Outcome: Progressing   Problem: Nutritional: Goal: Maintenance of adequate nutrition will improve Outcome: Progressing   Problem: Skin Integrity: Goal: Risk for impaired skin integrity will decrease Outcome: Progressing   Problem: Pain Managment: Goal: General experience of comfort will improve Outcome: Progressing   Problem: Safety: Goal: Ability to remain free from injury will improve Outcome: Progressing

## 2022-12-09 ENCOUNTER — Observation Stay (HOSPITAL_COMMUNITY)
Admission: EM | Admit: 2022-12-09 | Discharge: 2022-12-11 | Disposition: A | Discharge status: Home / Self Care | Payer: No Typology Code available for payment source | Attending: Internal Medicine | Admitting: Internal Medicine

## 2022-12-09 DIAGNOSIS — I509 Heart failure, unspecified: Secondary | ICD-10-CM | POA: Insufficient documentation

## 2022-12-09 DIAGNOSIS — K5780 Diverticulitis of intestine, part unspecified, with perforation and abscess without bleeding: Secondary | ICD-10-CM | POA: Diagnosis not present

## 2022-12-09 DIAGNOSIS — K5792 Diverticulitis of intestine, part unspecified, without perforation or abscess without bleeding: Secondary | ICD-10-CM | POA: Diagnosis not present

## 2022-12-09 DIAGNOSIS — I4891 Unspecified atrial fibrillation: Secondary | ICD-10-CM | POA: Diagnosis present

## 2022-12-09 DIAGNOSIS — E876 Hypokalemia: Secondary | ICD-10-CM | POA: Insufficient documentation

## 2022-12-09 DIAGNOSIS — R109 Unspecified abdominal pain: Secondary | ICD-10-CM | POA: Diagnosis present

## 2022-12-09 DIAGNOSIS — Z7901 Long term (current) use of anticoagulants: Secondary | ICD-10-CM | POA: Insufficient documentation

## 2022-12-09 DIAGNOSIS — Z79899 Other long term (current) drug therapy: Secondary | ICD-10-CM | POA: Insufficient documentation

## 2022-12-09 DIAGNOSIS — R1013 Epigastric pain: Secondary | ICD-10-CM

## 2022-12-09 DIAGNOSIS — N184 Chronic kidney disease, stage 4 (severe): Secondary | ICD-10-CM | POA: Diagnosis present

## 2022-12-09 DIAGNOSIS — E1122 Type 2 diabetes mellitus with diabetic chronic kidney disease: Secondary | ICD-10-CM | POA: Insufficient documentation

## 2022-12-09 DIAGNOSIS — R1110 Vomiting, unspecified: Secondary | ICD-10-CM | POA: Diagnosis present

## 2022-12-09 DIAGNOSIS — I130 Hypertensive heart and chronic kidney disease with heart failure and stage 1 through stage 4 chronic kidney disease, or unspecified chronic kidney disease: Secondary | ICD-10-CM | POA: Insufficient documentation

## 2022-12-09 LAB — COMPREHENSIVE METABOLIC PANEL
ALT: 13 U/L (ref 0–44)
AST: 21 U/L (ref 15–41)
Albumin: 2.8 g/dL — ABNORMAL LOW (ref 3.5–5.0)
Alkaline Phosphatase: 78 U/L (ref 38–126)
Anion gap: 9 (ref 5–15)
BUN: 46 mg/dL — ABNORMAL HIGH (ref 8–23)
CO2: 25 mmol/L (ref 22–32)
Calcium: 8.2 mg/dL — ABNORMAL LOW (ref 8.9–10.3)
Chloride: 105 mmol/L (ref 98–111)
Creatinine, Ser: 5.32 mg/dL — ABNORMAL HIGH (ref 0.61–1.24)
GFR, Estimated: 11 mL/min — ABNORMAL LOW (ref >60)
Glucose, Bld: 110 mg/dL — ABNORMAL HIGH (ref 70–99)
Potassium: 3.5 mmol/L (ref 3.5–5.1)
Sodium: 139 mmol/L (ref 135–145)
Total Bilirubin: 0.4 mg/dL (ref 0.3–1.2)
Total Protein: 5.4 g/dL — ABNORMAL LOW (ref 6.5–8.1)

## 2022-12-09 LAB — GLUCOSE, CAPILLARY: Glucose-Capillary: 104 mg/dL — ABNORMAL HIGH (ref 70–99)

## 2022-12-09 LAB — MAGNESIUM: Magnesium: 2.9 mg/dL — ABNORMAL HIGH (ref 1.7–2.4)

## 2022-12-09 MED ORDER — APIXABAN 5 MG PO TABS: 5.0000 mg | ORAL_TABLET | Freq: Two times a day (BID) | ORAL | 0 refills | Status: AC

## 2022-12-09 MED ORDER — METOPROLOL TARTRATE 50 MG PO TABS: 50.0000 mg | ORAL_TABLET | Freq: Two times a day (BID) | ORAL | 0 refills | Status: AC

## 2022-12-09 MED ORDER — ALLOPURINOL 100 MG PO TABS: 100.0000 mg | ORAL_TABLET | ORAL | 0 refills | Status: AC

## 2022-12-09 MED ORDER — AMOXICILLIN-POT CLAVULANATE 500-125 MG PO TABS: 1.0000 | ORAL_TABLET | Freq: Two times a day (BID) | ORAL | 0 refills | Status: AC

## 2022-12-09 MED ORDER — ALLOPURINOL 100 MG PO TABS: 100.0000 mg | ORAL_TABLET | Freq: Every day | ORAL | 0 refills | Status: DC

## 2022-12-09 MED ORDER — AMOXICILLIN-POT CLAVULANATE 875-125 MG PO TABS: 1.0000 | ORAL_TABLET | Freq: Two times a day (BID) | ORAL | 0 refills | Status: DC

## 2022-12-09 NOTE — TOC Initial Note (Signed)
Transition of Care [facility]) - Initial/Assessment Note    Patient Details  Name: Karl Robinson MRN: [id number] Date of Birth: 02-15-1951  Transition of Care [facility]) CM/SW Contact:    Durenda Guthrie, RN Phone Number: 12/09/2022, 8:36 AM  Clinical Narrative:                  Transition of Care [facility]) Department has reviewed patient and no TOC needs have been identified at this time. We will continue to monitor patient advancement through Interdisciplinary progressions and if new patient needs arise, please place a consult.       Patient Goals and CMS Choice            Expected Discharge Plan and Services                                              Prior Living Arrangements/Services                       Activities of Daily Living      Permission Sought/Granted                  Emotional Assessment              Admission diagnosis:  Acute diverticulitis [K57.92] Diverticulitis [K57.92] Patient Active Problem List   Diagnosis Date Noted   Acute diverticulitis 12/08/2022   Atrial fibrillation with RVR (HCC) 12/08/2022   OSA (obstructive sleep apnea) 12/08/2022   Diverticulitis 12/08/2022   Chronic kidney disease, stage 4 (severe) (HCC) 10/01/2022   Acute exacerbation of CHF (congestive heart failure) (HCC) 09/29/2022   Acute hypoxic respiratory failure (HCC) 09/28/2022   Cough 07/06/2013   Derangement of right acromioclavicular joint 06/19/2013   Gout    VITAMIN D DEFICIENCY 07/18/2009   OSTEOARTHROS UNSPEC WHETHER GEN/LOC UNSPEC SITE 07/11/2009   Benign secondary hypertension due to primary aldosteronism (HCC) 07/17/2008   Type 2 diabetes mellitus with chronic kidney disease (HCC) 06/22/2007   HYPERPLASIA, ADRENAL 06/22/2007   Hyperlipidemia 06/22/2007   ANEMIA-NOS 06/22/2007   CHANGES IN VASCULAR APPEARANCE OF RETINA 06/22/2007   MIXED HEARING LOSS UNSPECIFIED 06/22/2007   GERD 06/22/2007   DIVERTICULOSIS OF COLON 06/22/2007    BENIGN PROSTATIC HYPERTROPHY 06/22/2007   ARTHRITIS 06/22/2007   DYSPNEA ON EXERTION 06/22/2007   RENAL INSUFFICIENCY 06/21/2007   PCP:  [facility]:   [facility] [phone] [place], [place] - [address] [place] [address] Phone: [phone] Fax: [phone]  [facility] - [place], [place] - [address] [place] [address] Phone: [phone] Fax: [phone]  [facility] [address] [place] [address] Phone: [phone] Fax: [phone]     Social Determinants of Health (SDOH) Social History: SDOH Screenings   Food Insecurity: No Food Insecurity (09/29/2022)  Housing: Low Risk  (09/29/2022)  Transportation Needs: No Transportation Needs (09/29/2022)  Utilities: Not At Risk (09/29/2022)  Tobacco Use: Low Risk  (12/08/2022)   SDOH Interventions:     Readmission Risk Interventions     No data to display

## 2022-12-10 ENCOUNTER — Emergency Department (HOSPITAL_COMMUNITY): Payer: No Typology Code available for payment source

## 2022-12-10 ENCOUNTER — Other Ambulatory Visit: Payer: Self-pay

## 2022-12-10 DIAGNOSIS — N184 Chronic kidney disease, stage 4 (severe): Secondary | ICD-10-CM | POA: Diagnosis not present

## 2022-12-10 DIAGNOSIS — I509 Heart failure, unspecified: Secondary | ICD-10-CM | POA: Diagnosis not present

## 2022-12-10 DIAGNOSIS — R1013 Epigastric pain: Secondary | ICD-10-CM

## 2022-12-10 DIAGNOSIS — R1110 Vomiting, unspecified: Secondary | ICD-10-CM | POA: Diagnosis present

## 2022-12-10 DIAGNOSIS — Z7901 Long term (current) use of anticoagulants: Secondary | ICD-10-CM | POA: Diagnosis not present

## 2022-12-10 DIAGNOSIS — I130 Hypertensive heart and chronic kidney disease with heart failure and stage 1 through stage 4 chronic kidney disease, or unspecified chronic kidney disease: Secondary | ICD-10-CM | POA: Diagnosis not present

## 2022-12-10 DIAGNOSIS — E876 Hypokalemia: Secondary | ICD-10-CM | POA: Diagnosis not present

## 2022-12-10 DIAGNOSIS — I4891 Unspecified atrial fibrillation: Secondary | ICD-10-CM | POA: Diagnosis not present

## 2022-12-10 DIAGNOSIS — K5780 Diverticulitis of intestine, part unspecified, with perforation and abscess without bleeding: Secondary | ICD-10-CM | POA: Diagnosis not present

## 2022-12-10 DIAGNOSIS — Z79899 Other long term (current) drug therapy: Secondary | ICD-10-CM | POA: Diagnosis not present

## 2022-12-10 DIAGNOSIS — R109 Unspecified abdominal pain: Secondary | ICD-10-CM | POA: Diagnosis present

## 2022-12-10 DIAGNOSIS — E1122 Type 2 diabetes mellitus with diabetic chronic kidney disease: Secondary | ICD-10-CM | POA: Diagnosis not present

## 2022-12-10 DIAGNOSIS — R10816 Epigastric abdominal tenderness: Secondary | ICD-10-CM | POA: Diagnosis present

## 2022-12-10 LAB — CBC WITH DIFFERENTIAL/PLATELET
Abs Immature Granulocytes: 0.02 10*3/uL (ref 0.00–0.07)
Basophils Absolute: 0 10*3/uL (ref 0.0–0.1)
Basophils Relative: 0 %
Eosinophils Absolute: 0 10*3/uL (ref 0.0–0.5)
Eosinophils Relative: 0 %
HCT: 37.9 % — ABNORMAL LOW (ref 39.0–52.0)
Hemoglobin: 12.8 g/dL — ABNORMAL LOW (ref 13.0–17.0)
Immature Granulocytes: 0 %
Lymphocytes Relative: 6 %
Lymphs Abs: 0.5 10*3/uL — ABNORMAL LOW (ref 0.7–4.0)
MCH: 26.7 pg (ref 26.0–34.0)
MCHC: 33.8 g/dL (ref 30.0–36.0)
MCV: 79.1 fL — ABNORMAL LOW (ref 80.0–100.0)
Monocytes Absolute: 0.2 10*3/uL (ref 0.1–1.0)
Monocytes Relative: 3 %
Neutro Abs: 6.5 10*3/uL (ref 1.7–7.7)
Neutrophils Relative %: 91 %
Platelets: 180 10*3/uL (ref 150–400)
RBC: 4.79 MIL/uL (ref 4.22–5.81)
RDW: 13.4 % (ref 11.5–15.5)
WBC: 7.2 10*3/uL (ref 4.0–10.5)
nRBC: 0 % (ref 0.0–0.2)

## 2022-12-10 LAB — COMPREHENSIVE METABOLIC PANEL
ALT: 17 U/L (ref 0–44)
AST: 29 U/L (ref 15–41)
Albumin: 3.3 g/dL — ABNORMAL LOW (ref 3.5–5.0)
Alkaline Phosphatase: 92 U/L (ref 38–126)
Anion gap: 13 (ref 5–15)
BUN: 29 mg/dL — ABNORMAL HIGH (ref 8–23)
CO2: 22 mmol/L (ref 22–32)
Calcium: 7.9 mg/dL — ABNORMAL LOW (ref 8.9–10.3)
Chloride: 103 mmol/L (ref 98–111)
Creatinine, Ser: 3.73 mg/dL — ABNORMAL HIGH (ref 0.61–1.24)
GFR, Estimated: 16 mL/min — ABNORMAL LOW (ref >60)
Glucose, Bld: 107 mg/dL — ABNORMAL HIGH (ref 70–99)
Potassium: 2.7 mmol/L — CL (ref 3.5–5.1)
Sodium: 138 mmol/L (ref 135–145)
Total Bilirubin: 0.8 mg/dL (ref 0.3–1.2)
Total Protein: 6.2 g/dL — ABNORMAL LOW (ref 6.5–8.1)

## 2022-12-10 LAB — LIPASE, BLOOD: Lipase: 48 U/L (ref 11–51)

## 2022-12-10 LAB — MRSA NEXT GEN BY PCR, NASAL: MRSA by PCR Next Gen: NOT DETECTED

## 2022-12-10 LAB — HEPATITIS B SURFACE ANTIGEN: Hepatitis B Surface Ag: NONREACTIVE

## 2022-12-10 LAB — POTASSIUM: Potassium: 3.3 mmol/L — ABNORMAL LOW (ref 3.5–5.1)

## 2022-12-10 LAB — LACTIC ACID, PLASMA
Lactic Acid, Venous: 1.2 mmol/L (ref 0.5–1.9)
Lactic Acid, Venous: 1.1 mmol/L (ref 0.5–1.9)

## 2022-12-10 MED ORDER — PANTOPRAZOLE SODIUM 40 MG IV SOLR
40.0000 mg | INTRAVENOUS | Status: DC
  Filled 2022-12-10: qty 10

## 2022-12-10 MED ORDER — AMOXICILLIN-POT CLAVULANATE 500-125 MG PO TABS
1.0000 | ORAL_TABLET | Freq: Two times a day (BID) | ORAL | Status: DC
  Administered 2022-12-10 (×2): 1 via ORAL
  Filled 2022-12-10 (×2): qty 1

## 2022-12-10 MED ORDER — POLYETHYLENE GLYCOL 3350 17 G PO PACK: 17.0000 g | PACK | Freq: Every day | ORAL | Status: DC | PRN

## 2022-12-10 MED ORDER — DOCUSATE SODIUM 100 MG PO CAPS
100.0000 mg | ORAL_CAPSULE | Freq: Two times a day (BID) | ORAL | Status: DC
  Administered 2022-12-10 (×2): 100 mg via ORAL
  Filled 2022-12-10 (×2): qty 1

## 2022-12-10 MED ORDER — ROSUVASTATIN CALCIUM 5 MG PO TABS
10.0000 mg | ORAL_TABLET | Freq: Every day | ORAL | Status: DC
  Administered 2022-12-10: 10 mg via ORAL
  Filled 2022-12-10: qty 1

## 2022-12-10 MED ORDER — MORPHINE SULFATE (PF) 4 MG/ML IV SOLN
4.0000 mg | Freq: Once | INTRAVENOUS | Status: AC
  Administered 2022-12-10: 4 mg via INTRAVENOUS
  Filled 2022-12-10: qty 1

## 2022-12-10 MED ORDER — METOPROLOL TARTRATE 50 MG PO TABS
50.0000 mg | ORAL_TABLET | Freq: Two times a day (BID) | ORAL | Status: DC
  Administered 2022-12-10 (×2): 50 mg via ORAL
  Filled 2022-12-10: qty 1
  Filled 2022-12-10: qty 2

## 2022-12-10 MED ORDER — CHLORHEXIDINE GLUCONATE CLOTH 2 % EX PADS: 6.0000 | MEDICATED_PAD | Freq: Every day | CUTANEOUS | Status: DC

## 2022-12-10 MED ORDER — METOPROLOL TARTRATE 5 MG/5ML IV SOLN
2.5000 mg | Freq: Once | INTRAVENOUS | Status: AC
  Administered 2022-12-10: 2.5 mg via INTRAVENOUS
  Filled 2022-12-10: qty 5

## 2022-12-10 MED ORDER — POTASSIUM CHLORIDE CRYS ER 20 MEQ PO TBCR
40.0000 meq | EXTENDED_RELEASE_TABLET | Freq: Once | ORAL | Status: AC
  Administered 2022-12-10: 40 meq via ORAL
  Filled 2022-12-10: qty 2

## 2022-12-10 MED ORDER — ORAL CARE MOUTH RINSE: 15.0000 mL | OROMUCOSAL | Status: DC | PRN

## 2022-12-10 MED ORDER — ALBUTEROL SULFATE (2.5 MG/3ML) 0.083% IN NEBU: 2.5000 mg | INHALATION_SOLUTION | RESPIRATORY_TRACT | Status: DC | PRN

## 2022-12-10 MED ORDER — TRAZODONE HCL 50 MG PO TABS
25.0000 mg | ORAL_TABLET | Freq: Every evening | ORAL | Status: DC | PRN
  Administered 2022-12-10: 25 mg via ORAL
  Filled 2022-12-10: qty 1

## 2022-12-10 MED ORDER — ONDANSETRON HCL 4 MG/2ML IJ SOLN: 4.0000 mg | Freq: Four times a day (QID) | INTRAMUSCULAR | Status: DC | PRN

## 2022-12-10 MED ORDER — METOPROLOL TARTRATE 5 MG/5ML IV SOLN: 5.0000 mg | Freq: Four times a day (QID) | INTRAVENOUS | Status: DC | PRN

## 2022-12-10 MED ORDER — ONDANSETRON HCL 4 MG/2ML IJ SOLN
4.0000 mg | Freq: Once | INTRAMUSCULAR | Status: AC
  Administered 2022-12-10: 4 mg via INTRAVENOUS
  Filled 2022-12-10: qty 2

## 2022-12-10 MED ORDER — ACETAMINOPHEN 325 MG PO TABS
650.0000 mg | ORAL_TABLET | Freq: Four times a day (QID) | ORAL | Status: DC | PRN
  Administered 2022-12-10: 650 mg via ORAL
  Filled 2022-12-10: qty 2

## 2022-12-10 MED ORDER — FENTANYL CITRATE PF 50 MCG/ML IJ SOSY
50.0000 ug | PREFILLED_SYRINGE | Freq: Once | INTRAMUSCULAR | Status: AC
  Administered 2022-12-10: 50 ug via INTRAVENOUS
  Filled 2022-12-10: qty 1

## 2022-12-10 MED ORDER — METOPROLOL TARTRATE 5 MG/5ML IV SOLN
5.0000 mg | Freq: Once | INTRAVENOUS | Status: AC
  Administered 2022-12-10: 5 mg via INTRAVENOUS
  Filled 2022-12-10: qty 5

## 2022-12-10 MED ORDER — FERROUS SULFATE 325 (65 FE) MG PO TABS: 325.0000 mg | ORAL_TABLET | ORAL | Status: DC

## 2022-12-10 MED ORDER — PANTOPRAZOLE SODIUM 40 MG IV SOLR
40.0000 mg | Freq: Once | INTRAVENOUS | Status: AC
  Administered 2022-12-10: 40 mg via INTRAVENOUS
  Filled 2022-12-10: qty 10

## 2022-12-10 MED ORDER — ACETAMINOPHEN 650 MG RE SUPP: 650.0000 mg | Freq: Four times a day (QID) | RECTAL | Status: DC | PRN

## 2022-12-10 MED ORDER — ONDANSETRON HCL 4 MG PO TABS
4.0000 mg | ORAL_TABLET | Freq: Four times a day (QID) | ORAL | Status: DC | PRN
  Administered 2022-12-10: 4 mg via ORAL
  Filled 2022-12-10: qty 1

## 2022-12-10 MED ORDER — APIXABAN 5 MG PO TABS
5.0000 mg | ORAL_TABLET | Freq: Two times a day (BID) | ORAL | Status: DC
  Administered 2022-12-10 (×2): 5 mg via ORAL
  Filled 2022-12-10 (×2): qty 1

## 2022-12-10 MED ORDER — PANTOPRAZOLE SODIUM 40 MG PO TBEC: 40.0000 mg | DELAYED_RELEASE_TABLET | Freq: Every day | ORAL | Status: DC

## 2022-12-10 NOTE — ED Notes (Signed)
Called [facility] for transport to [facility]

## 2022-12-10 NOTE — ED Triage Notes (Signed)
Patient BIB EMS for evaluation of abdominal pain.  Reports was recently admitted for diverticulitis and sent home after admission.  Had first dialysis treatment today and reports he was "fine."  Tonight, two hours PTA, states his abdominal pain returned.  C/o "cramping" and vomiting.  Given Fentanyl 200 mcg IV and Zofran 4 mg IV PTA

## 2022-12-10 NOTE — Consult Note (Signed)
Reason for Consult: ESRD Referring Physician:  Dr. Kirby Crigler  Chief Complaint:  Abdominal pain  Assessment/Plan: ESRD - pt did receive a full treatment 5/22 and no absolute indication today. Will plan on next HD for 5/24 if he's still here. WIll place on 2nd shift in case he's stable for discharge tomorrow. He's 2nd shift at [facility]. MWF.  4K bath; already received Klorcon x1 today.  Renal osteodystrophy - will check a phos for binder management. Anemia - no ESA needed. HTN - restart home regimen; UF will help as well. Atrial fibrillaion - rate controlled and on Eliquis.   HPI: Karl Robinson is an 72 y.o. 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 He was just hospitalized with atrial fibrillation but started just initiating dialysis at the [facility] on 12/09/2022. He tolerated the treatment with no hypotensive episodes noted, no dizziness, CP or cramping but after returning home ie had severe epigastric abdominal pain described as crampy associated with nausea and vomiting only last night. He also had subjective fevers and stated this is how he's felt whenever he's had an episode of diverticulitis.  He was found to be hypertensive and given Zofran, noted potassium was 2.7.   ROS Pertinent items are noted in HPI.  Chemistry and CBC: Creatinine, Ser  Date/Time Value Ref Range Status  12/10/2022 12:50 AM 3.73 (H) 0.61 - 1.24 mg/dL Final  16/04/9603 54:09 AM 5.32 (H) 0.61 - 1.24 mg/dL Final  81/19/1478 29:56 PM 4.91 (H) 0.61 - 1.24 mg/dL Final  21/30/8657 84:69 AM 4.31 (H) 0.61 - 1.24 mg/dL Final  62/95/2841 32:44 AM 4.30 (H) 0.61 - 1.24 mg/dL Final  07/22/7251 66:44 AM 4.37 (H) 0.61 - 1.24 mg/dL Final  03/47/4259 56:38 PM 4.33 (H) 0.61 - 1.24 mg/dL Final  75/64/3329 51:88 AM 2.03 (H) 0.61 - 1.24 mg/dL Final  41/66/0630 16:01 PM 1.8 (H) 0.4 - 1.5 mg/dL Final  09/32/3557 32:20 AM 1.53  mg/dL   25/42/7062 37:62 PM 8.31 (H) 0.40 - 1.50 mg/dL Final  51/76/1607 37:10 PM 1.4 0.4 - 1.5 mg/dL Final  62/69/4854 62:70 PM 1.4 0.4 - 1.5 mg/dL Final  35/00/9381 82:99 AM 1.6 (H) 0.4 - 1.5 mg/dL Final   Recent Labs  Lab 12/07/22 2124 12/09/22 0556 12/10/22 0050  NA 140 139 138  K 3.3* 3.5 2.7*  CL 104 105 103  CO2 20* 25 22  GLUCOSE 156* 110* 107*  BUN 41* 46* 29*  CREATININE 4.91* 5.32* 3.73*  CALCIUM 9.2 8.2* 7.9*   Recent Labs  Lab 12/07/22 2124 12/10/22 0050  WBC 7.0 7.2  NEUTROABS 6.2 6.5  HGB 12.9* 12.8*  HCT 38.4* 37.9*  MCV 79.7* 79.1*  PLT 203 180   Liver Function Tests: Recent Labs  Lab 12/07/22 2124 12/09/22 0556 12/10/22 0050  AST 27 21 29   ALT 15 13 17   ALKPHOS 106 78 92  BILITOT 0.9 0.4 0.8  PROT 6.9 5.4* 6.2*  ALBUMIN 3.8 2.8* 3.3*   Recent Labs  Lab 12/07/22 2124 12/10/22 0050  LIPASE 55* 48   No results for input(s): "AMMONIA" in the last 168 hours. Cardiac Enzymes: No results for input(s): "CKTOTAL", "CKMB", "CKMBINDEX", "TROPONINI" in the last 168 hours. Iron Studies: No results for input(s): "IRON", "TIBC", "TRANSFERRIN", "FERRITIN" in the last 72 hours. PT/INR: @LABRCNTIP (inr:5)  Xrays/Other Studies: ) Results for orders placed or performed during the hospital encounter of 12/09/22 (from the past 48 hour(s))  Comprehensive metabolic  panel     Status: Abnormal   Collection Time: 12/10/22 12:50 AM  Result Value Ref Range   Sodium 138 135 - 145 mmol/L   Potassium 2.7 (LL) 3.5 - 5.1 mmol/L    Comment: CRITICAL RESULT CALLED TO, READ BACK BY AND VERIFIED WITH DEAN,T RN @ 0144 12/10/22 BY CHILDRESS,E   Chloride 103 98 - 111 mmol/L   CO2 22 22 - 32 mmol/L   Glucose, Bld 107 (H) 70 - 99 mg/dL    Comment: Glucose reference range applies only to samples taken after fasting for at least 8 hours.   BUN 29 (H) 8 - 23 mg/dL   Creatinine, Ser 1.61 (H) 0.61 - 1.24 mg/dL   Calcium 7.9 (L) 8.9 - 10.3 mg/dL   Total Protein 6.2 (L) 6.5 -  8.1 g/dL   Albumin 3.3 (L) 3.5 - 5.0 g/dL   AST 29 15 - 41 U/L   ALT 17 0 - 44 U/L   Alkaline Phosphatase 92 38 - 126 U/L   Total Bilirubin 0.8 0.3 - 1.2 mg/dL   GFR, Estimated 16 (L) >60 mL/min    Comment: (NOTE) Calculated using the CKD-EPI Creatinine Equation (2021)    Anion gap 13 5 - 15    Comment: Performed at [facility], [address], [place], [place] [address]  CBC with Differential     Status: Abnormal   Collection Time: 12/10/22 12:50 AM  Result Value Ref Range   WBC 7.2 4.0 - 10.5 K/uL   RBC 4.79 4.22 - 5.81 MIL/uL   Hemoglobin 12.8 (L) 13.0 - 17.0 g/dL   HCT 54.0 (L) 98.1 - 19.1 %   MCV 79.1 (L) 80.0 - 100.0 fL   MCH 26.7 26.0 - 34.0 pg   MCHC 33.8 30.0 - 36.0 g/dL   RDW 47.8 29.5 - 62.1 %   Platelets 180 150 - 400 K/uL   nRBC 0.0 0.0 - 0.2 %   Neutrophils Relative % 91 %   Neutro Abs 6.5 1.7 - 7.7 K/uL   Lymphocytes Relative 6 %   Lymphs Abs 0.5 (L) 0.7 - 4.0 K/uL   Monocytes Relative 3 %   Monocytes Absolute 0.2 0.1 - 1.0 K/uL   Eosinophils Relative 0 %   Eosinophils Absolute 0.0 0.0 - 0.5 K/uL   Basophils Relative 0 %   Basophils Absolute 0.0 0.0 - 0.1 K/uL   Immature Granulocytes 0 %   Abs Immature Granulocytes 0.02 0.00 - 0.07 K/uL    Comment: Performed at [facility], [address], [place], [place] [address]  Lipase, blood     Status: None   Collection Time: 12/10/22 12:50 AM  Result Value Ref Range   Lipase 48 11 - 51 U/L    Comment: Performed at [facility], [address], [place], [place] [address]  Lactic acid, plasma     Status: None   Collection Time: 12/10/22 12:50 AM  Result Value Ref Range   Lactic Acid, Venous 1.2 0.5 - 1.9 mmol/L    Comment: Performed at [facility], [address], [place], [place] [address]   CT ABDOMEN PELVIS WO CONTRAST  Result Date: 12/10/2022 CLINICAL DATA:  Abdominal pain with nausea and vomiting. Recent history of  diverticulitis. EXAM: CT ABDOMEN AND PELVIS WITHOUT CONTRAST TECHNIQUE: Multidetector CT imaging of the abdomen and pelvis was performed following the standard protocol without IV contrast. RADIATION DOSE REDUCTION: This exam was performed according to the  departmental dose-optimization program which includes automated exposure control, adjustment of the mA and/or kV according to patient size and/or use of iterative reconstruction technique. COMPARISON:  12/07/2022. FINDINGS: Lower chest: The heart is normal in size and there is a small pericardial effusion. Mild atelectasis is present at the lung bases. Hepatobiliary: A subcentimeter hypodensity is present in the left lobe of the liver, statistically most likely representing cysts or hemangioma. Stones are present within the gallbladder. No biliary ductal dilatation. Pancreas: Unremarkable. No pancreatic ductal dilatation or surrounding inflammatory changes. Spleen: Normal in size without focal abnormality. Adrenals/Urinary Tract: The adrenal glands are within normal limits. There are bilateral renal cysts and calculi. No hydronephrosis bilaterally. The bladder is unremarkable. Stomach/Bowel: Stomach is within normal limits. Appendix appears normal. No evidence of bowel wall thickening, distention, or inflammatory changes. No free air or pneumatosis. Multiple diverticula are present along the colon without evidence of diverticulitis. There is diastasis of the rectus abdominus with a broad-based umbilical hernia containing nonobstructed bowel. Vascular/Lymphatic: Aortic atherosclerosis. No enlarged abdominal or pelvic lymph nodes. Reproductive: Prostate is unremarkable. Other: A small amount of free fluid is present in the pelvis. Musculoskeletal: Degenerative changes are present in the thoracolumbar spine. No acute osseous abnormality. IMPRESSION: 1. No acute intra-abdominal process. 2. Diverticulosis without diverticulitis. 3. Bilateral nephrolithiasis. 4. Small  amount of free fluid in the pelvis. 5. Aortic atherosclerosis. Electronically Signed   By: Thornell Sartorius M.D.   On: 12/10/2022 03:18   DG Chest Port 1 View  Result Date: 12/10/2022 CLINICAL DATA:  Vomiting EXAM: PORTABLE CHEST 1 VIEW COMPARISON:  None Available. FINDINGS: The heart size and mediastinal contours are within normal limits. Both lungs are clear. The visualized skeletal structures are unremarkable. IMPRESSION: No active disease. Electronically Signed   By: Helyn Numbers M.D.   On: 12/10/2022 00:40   ECHOCARDIOGRAM COMPLETE  Result Date: 12/08/2022    ECHOCARDIOGRAM REPORT   Patient Name:   Karl Robinson Date of Exam: 12/08/2022 Medical Rec #:  [id number]      Height:       69.0 in Accession #:    [id number]     Weight:       192.2 lb Date of Birth:  [birth year]      BSA:          2.032 m Patient Age:    71 years       BP:           171/95 mmHg Patient Gender: M              HR:           74 bpm. Exam Location:  Inpatient Procedure: 2D Echo, Cardiac Doppler and Color Doppler Indications:    Atrial Fibrillation I48.91  History:        Patient has prior history of Echocardiogram examinations, most                 recent 09/29/2022. CHF, Arrythmias:Atrial Fibrillation,                 Signs/Symptoms:Dyspnea; Risk Factors:Diabetes.  Sonographer:    Darlys Gales Referring Phys: [phone] DEBBY CROSLEY IMPRESSIONS  1. Left ventricular ejection fraction, by estimation, is 60 to 65%. The left ventricle has normal function. The left ventricle has no regional wall motion abnormalities. There is mild concentric left ventricular hypertrophy. Left ventricular diastolic parameters are consistent with Grade II diastolic dysfunction (pseudonormalization). Elevated left atrial pressure.  2. Right ventricular systolic function is  normal. The right ventricular size is normal. There is mildly elevated pulmonary artery systolic pressure.  3. Left atrial size was mild to moderately dilated.  4. The mitral valve is normal in  structure. Moderate mitral valve regurgitation. No evidence of mitral stenosis.  5. Tricuspid valve regurgitation is mild to moderate.  6. The aortic valve is tricuspid. Aortic valve regurgitation is not visualized. No aortic stenosis is present.  7. The inferior vena cava is normal in size with greater than 50% respiratory variability, suggesting right atrial pressure of 3 mmHg. Comparison(s): No significant change from prior study. Prior images reviewed side by side. Pericardial effusion is even smaller, probably physiological. FINDINGS  Left Ventricle: Left ventricular ejection fraction, by estimation, is 60 to 65%. The left ventricle has normal function. The left ventricle has no regional wall motion abnormalities. The left ventricular internal cavity size was normal in size. There is  mild concentric left ventricular hypertrophy. Left ventricular diastolic parameters are consistent with Grade II diastolic dysfunction (pseudonormalization). Elevated left atrial pressure. Right Ventricle: The right ventricular size is normal. No increase in right ventricular wall thickness. Right ventricular systolic function is normal. There is mildly elevated pulmonary artery systolic pressure. The tricuspid regurgitant velocity is 3.09  m/s, and with an assumed right atrial pressure of 3 mmHg, the estimated right ventricular systolic pressure is 41.2 mmHg. Left Atrium: Left atrial size was mild to moderately dilated. Right Atrium: Right atrial size was normal in size. Pericardium: Trivial pericardial effusion is present. Mitral Valve: The mitral valve is normal in structure. Moderate mitral valve regurgitation, with posteriorly-directed jet. No evidence of mitral valve stenosis. Tricuspid Valve: The tricuspid valve is normal in structure. Tricuspid valve regurgitation is mild to moderate. No evidence of tricuspid stenosis. Aortic Valve: The aortic valve is tricuspid. Aortic valve regurgitation is not visualized. No aortic  stenosis is present. Aortic valve mean gradient measures 6.0 mmHg. Aortic valve peak gradient measures 9.5 mmHg. Aortic valve area, by VTI measures 1.91 cm. Pulmonic Valve: The pulmonic valve was normal in structure. Pulmonic valve regurgitation is not visualized. No evidence of pulmonic stenosis. Aorta: The aortic root is normal in size and structure. Venous: The inferior vena cava is normal in size with greater than 50% respiratory variability, suggesting right atrial pressure of 3 mmHg. IAS/Shunts: No atrial level shunt detected by color flow Doppler.  LEFT VENTRICLE PLAX 2D LVIDd:         5.30 cm   Diastology LVIDs:         3.70 cm   LV e' medial:    4.57 cm/s LV PW:         1.50 cm   LV E/e' medial:  21.6 LV IVS:        1.20 cm   LV e' lateral:   5.44 cm/s LVOT diam:     1.80 cm   LV E/e' lateral: 18.2 LV SV:         66 LV SV Index:   33 LVOT Area:     2.54 cm  RIGHT VENTRICLE RV S prime:     16.30 cm/s TAPSE (M-mode): 2.4 cm LEFT ATRIUM             Index        RIGHT ATRIUM           Index LA Vol (A2C):   67.9 ml 33.42 ml/m  RA Area:     17.90 cm LA Vol (A4C):   82.5 ml 40.61 ml/m  RA Volume:   45.60 ml  22.45 ml/m LA Biplane Vol: 77.0 ml 37.90 ml/m  AORTIC VALVE AV Area (Vmax):    1.88 cm AV Area (Vmean):   1.72 cm AV Area (VTI):     1.91 cm AV Vmax:           154.00 cm/s AV Vmean:          112.000 cm/s AV VTI:            0.346 m AV Peak Grad:      9.5 mmHg AV Mean Grad:      6.0 mmHg LVOT Vmax:         114.00 cm/s LVOT Vmean:        75.800 cm/s LVOT VTI:          0.260 m LVOT/AV VTI ratio: 0.75 MITRAL VALVE               TRICUSPID VALVE MV Area (PHT): 4.21 cm    TR Peak grad:   38.2 mmHg MV Decel Time: 180 msec    TR Vmax:        309.00 cm/s MV E velocity: 98.85 cm/s MV A velocity: 69.40 cm/s  SHUNTS MV E/A ratio:  1.42        Systemic VTI:  0.26 m                            Systemic Diam: 1.80 cm Mihai Croitoru MD Electronically signed by Thurmon Fair MD Signature Date/Time: 12/08/2022/4:32:42  PM    Final     PMH:   Past Medical History:  Diagnosis Date   Adrenal hyperplasia (HCC)    Anemia, unspecified    Arthropathy, unspecified, site unspecified    Benign hypertension, endocrine    Cervical stenosis of spine    Changes in vascular appearance of retina    CHF (congestive heart failure) (HCC)    CKD (chronic kidney disease) stage 3, GFR 30-59 ml/min (HCC)    followed by [facility] (Dr. Patton Salles)   Diverticulosis of colon (without mention of hemorrhage)    Esophageal reflux    Gout    HLD (hyperlipidemia)    Hyperaldosteronism, unspecified (HCC)    Hypertrophy of prostate without urinary obstruction and other lower urinary tract symptoms (LUTS)    Mixed hearing loss, unspecified    Obesity    Ruptured lumbar disc    L5-S1   Type II or unspecified type diabetes mellitus without mention of complication, not stated as uncontrolled    Unspecified vitamin D deficiency    Varicocele    Left    PSH:   Past Surgical History:  Procedure Laterality Date   Bone Scan  11/16/07   Normal   CARDIAC CATHETERIZATION  12/14/00   Dr. Leota Jacobsen   CARDIAC CATHETERIZATION  1989   Dr. Sena Slate   CARDIOVASCULAR STRESS TEST  10/21/05   WNL, but had EKG changes   Cervical MRI  04/16/03   Stenosis of C4/5,C5/6 with Narrowing   EMG/NCS  05/16/03   RVL Normal, Borderline RCTS   ESOPHAGOGASTRODUODENOSCOPY  11/98   Gastritis; Duod ulcer   ESOPHAGOGASTRODUODENOSCOPY  05/06/01   Dr. Arty Baumgartner Mucosa Bx-Negative   FLEXIBLE SIGMOIDOSCOPY  07/18/98   Dr. Woodroe Mode   Lumbosacral MRI  11/16/07   L5-S1 Disc Rupture   NECK SURGERY  10/26/03   Dr. Otelia Sergeant   Pelvic MRI  11/16/07   Normal   Renal Arterial  US  08/10/03   Negative   ROTATOR CUFF REPAIR Right 01/17/04   Testicular US  09/07/00   Left-sided varicocele (small)   TYMPANIC MEMBRANE REPAIR Right 1972   Prosthesis   US ECHOCARDIOGRAPHY  12/07/00   EF 70 %    Allergies:  Allergies  Allergen Reactions    Spironolactone Other (See Comments)    Gynecomastia   Hydromorphone Hcl     REACTION: UNSPECIFIED   Oxycodone Hcl     REACTION: itch    Medications:   Prior to Admission medications   Medication Sig Start Date End Date Taking? Authorizing Provider  acetaminophen (TYLENOL) 500 MG tablet Take 500 mg by mouth 2 (two) times daily.   Yes [provider]  amLODipine (NORVASC) 10 MG tablet Take 10 mg by mouth daily.   Yes [provider]  amoxicillin-clavulanate (AUGMENTIN) 500-125 MG tablet Take 1 tablet by mouth 2 (two) times daily for 7 doses. 12/09/22 12/13/22 Yes Briant Cedar, MD  Cholecalciferol (VITAMIN D3 PO) Take 2,000 Units by mouth daily.   Yes [provider]  dutasteride (AVODART) 0.5 MG capsule Take 0.5 mg by mouth daily.   Yes [provider]  ferrous sulfate 325 (65 FE) MG tablet Take 325 mg by mouth every Monday, Wednesday, and Friday.   Yes [provider]  finasteride (PROSCAR) 5 MG tablet Take 5 mg by mouth daily.   Yes [provider]  furosemide (LASIX) 40 MG tablet Take 40 mg by mouth daily.   Yes [provider]  lisinopril (ZESTRIL) 40 MG tablet Take 40 mg by mouth daily. 12/03/22  Yes [provider]  Multiple Vitamin (MULTIVITAMIN PO) Take 1 tablet by mouth in the morning.   Yes [provider]  pantoprazole (PROTONIX) 40 MG tablet Take 1 tablet (40 mg total) by mouth daily. 10/02/22  Yes Rana Snare, DO  potassium chloride SA (KLOR-CON M) 20 MEQ tablet Take 20 mEq by mouth 2 (two) times daily.   Yes [provider]  prazosin (MINIPRESS) 2 MG capsule Take 4 mg by mouth at bedtime.   Yes [provider]  rosuvastatin (CRESTOR) 10 MG tablet Take 1 tablet (10 mg total) by mouth daily. 10/02/22  Yes Rana Snare, DO  traMADol (ULTRAM) 50 MG tablet Take 50 mg by mouth every 6 (six) hours as needed for moderate pain. 12/06/18  Yes [provider]  allopurinol  (ZYLOPRIM) 100 MG tablet Take 1 tablet (100 mg total) by mouth 3 (three) times a week. Take after HD 12/09/22 01/08/23  Briant Cedar, MD  apixaban (ELIQUIS) 5 MG TABS tablet Take 1 tablet (5 mg total) by mouth 2 (two) times daily. 12/09/22 01/08/23  Briant Cedar, MD  cloNIDine (CATAPRES - DOSED IN MG/24 HR) 0.3 mg/24hr patch Place 0.3 mg onto the skin once a week. 11/10/22   [provider]  furosemide (LASIX) 40 MG tablet Take 1 tablet (40 mg total) by mouth 2 (two) times daily. Patient taking differently: Take 40 mg by mouth daily. 10/01/22 12/08/22  Rana Snare, DO  metoprolol tartrate (LOPRESSOR) 50 MG tablet Take 1 tablet (50 mg total) by mouth 2 (two) times daily. 12/09/22 01/08/23  Briant Cedar, MD    Discontinued Meds:   Medications Discontinued During This Encounter  Medication Reason   pantoprazole (PROTONIX) EC tablet 40 mg     Social History:  reports that he has never smoked. He has never used smokeless tobacco. He reports that he  does not drink alcohol and does not use drugs.  Family History:   Family History  Problem Relation Age of Onset   Cirrhosis Father        ETOH   Hypertension Mother        Kidney transplant   Congestive Heart Failure Mother    Hypertension Brother    Drug abuse Brother    Stroke Maternal Grandmother     Blood pressure (!) 166/93, pulse 76, temperature 99.3 F (37.4 C), temperature source Oral, resp. rate 18, height 5\' 9"  (1.753 m), weight 77.3 kg, SpO2 98 %. General appearance: alert, cooperative, and appears stated age Head: Normocephalic, without obvious abnormality, atraumatic Eyes: negative Neck: no adenopathy, no carotid bruit, no JVD, supple, symmetrical, trachea midline, and thyroid not enlarged, symmetric, no tenderness/mass/nodules Back: symmetric, no curvature. ROM normal. No CVA tenderness. Resp: clear to auscultation bilaterally Cardio: irregular rate and rhythm GI: soft, no rebound Extremities:  extremities normal, atraumatic, no cyanosis or edema Pulses: 2+ and symmetric Rt BCF tortuous, mildly pulsatile       Prisilla Kocsis, Len Blalock, MD 12/10/2022, 11:48 AM

## 2022-12-10 NOTE — ED Notes (Signed)
ED TO INPATIENT HANDOFF REPORT  Name/Age/Gender Karl Robinson 72 y.o. male  Code Status    Code Status Orders  (From admission, onward)           Start     Ordered   12/10/22 0826  Full code  Continuous       Question:  By:  Answer:  Consent: discussion documented in EHR   12/10/22 0826           Code Status History     Date Active Date Inactive Code Status Order ID Comments User Context   09/28/2022 2143 10/01/2022 2021 Full Code [id number]  Karoline Caldwell, MD ED       Home/SNF/Other Home  Chief Complaint Abdominal pain [R10.9] Vomiting [R11.10]  Level of Care/Admitting Diagnosis ED Disposition     ED Disposition  Admit   Condition  --   Comment  Hospital Area: [facility] [[id number]]  Level of Care: Telemetry Medical [104]  May place patient in observation at [facility] or [facility] if equivalent level of care is available:: No  Covid Evaluation: Asymptomatic - no recent exposure (last 10 days) testing not required  Diagnosis: Vomiting [207392]  Admitting Physician: Maryln Gottron [[id number]]  Attending Physician: [facility], MIR [email or web address] [[id number]]          Medical History Past Medical History:  Diagnosis Date   Adrenal hyperplasia (HCC)    Anemia, unspecified    Arthropathy, unspecified, site unspecified    Benign hypertension, endocrine    Cervical stenosis of spine    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 Patton Salles)   Diverticulosis of colon (without mention of hemorrhage)    Esophageal reflux    Gout    HLD (hyperlipidemia)    Hyperaldosteronism, unspecified (HCC)    Hypertrophy of prostate without urinary obstruction and other lower urinary tract symptoms (LUTS)    Mixed hearing loss, unspecified    Obesity    Ruptured lumbar disc    L5-S1   Type II or unspecified type diabetes mellitus  without mention of complication, not stated as uncontrolled    Unspecified vitamin D deficiency    Varicocele    Left    Allergies Allergies  Allergen Reactions   Spironolactone Other (See Comments)    Gynecomastia   Hydromorphone Hcl     REACTION: UNSPECIFIED   Oxycodone Hcl     REACTION: itch    IV Location/Drains/Wounds Patient Lines/Drains/Airways Status     Active Line/Drains/Airways     Name Placement date Placement time Site Days   Peripheral IV 12/10/22 20 G Left;Posterior Hand 12/10/22  0000  Hand  less than 1   Peripheral IV 12/10/22 20 G 1.88" Left Antecubital 12/10/22  0148  Antecubital  less than 1   Fistula / Graft Right Upper arm Arteriovenous fistula 09/29/22  2030  Upper arm  72            Labs/Imaging Results for orders placed or performed during the hospital encounter of 12/09/22 (from the past 48 hour(s))  Comprehensive metabolic panel     Status: Abnormal   Collection Time: 12/10/22 12:50 AM  Result Value Ref Range   Sodium 138 135 - 145 mmol/L   Potassium 2.7 (LL) 3.5 - 5.1 mmol/L    Comment: CRITICAL RESULT CALLED TO, READ BACK  BY AND VERIFIED WITH DEAN,T RN @ 0144 12/10/22 BY CHILDRESS,E   Chloride 103 98 - 111 mmol/L   CO2 22 22 - 32 mmol/L   Glucose, Bld 107 (H) 70 - 99 mg/dL    Comment: Glucose reference range applies only to samples taken after fasting for at least 8 hours.   BUN 29 (H) 8 - 23 mg/dL   Creatinine, Ser 1.61 (H) 0.61 - 1.24 mg/dL   Calcium 7.9 (L) 8.9 - 10.3 mg/dL   Total Protein 6.2 (L) 6.5 - 8.1 g/dL   Albumin 3.3 (L) 3.5 - 5.0 g/dL   AST 29 15 - 41 U/L   ALT 17 0 - 44 U/L   Alkaline Phosphatase 92 38 - 126 U/L   Total Bilirubin 0.8 0.3 - 1.2 mg/dL   GFR, Estimated 16 (L) >60 mL/min    Comment: (NOTE) Calculated using the CKD-EPI Creatinine Equation (2021)    Anion gap 13 5 - 15    Comment: Performed at [facility], [address], [place], [place] [address]  CBC with Differential     Status:  Abnormal   Collection Time: 12/10/22 12:50 AM  Result Value Ref Range   WBC 7.2 4.0 - 10.5 K/uL   RBC 4.79 4.22 - 5.81 MIL/uL   Hemoglobin 12.8 (L) 13.0 - 17.0 g/dL   HCT 54.0 (L) 98.1 - 19.1 %   MCV 79.1 (L) 80.0 - 100.0 fL   MCH 26.7 26.0 - 34.0 pg   MCHC 33.8 30.0 - 36.0 g/dL   RDW 47.8 29.5 - 62.1 %   Platelets 180 150 - 400 K/uL   nRBC 0.0 0.0 - 0.2 %   Neutrophils Relative % 91 %   Neutro Abs 6.5 1.7 - 7.7 K/uL   Lymphocytes Relative 6 %   Lymphs Abs 0.5 (L) 0.7 - 4.0 K/uL   Monocytes Relative 3 %   Monocytes Absolute 0.2 0.1 - 1.0 K/uL   Eosinophils Relative 0 %   Eosinophils Absolute 0.0 0.0 - 0.5 K/uL   Basophils Relative 0 %   Basophils Absolute 0.0 0.0 - 0.1 K/uL   Immature Granulocytes 0 %   Abs Immature Granulocytes 0.02 0.00 - 0.07 K/uL    Comment: Performed at [facility], [address] [place], [place] [address]  Lipase, blood     Status: None   Collection Time: 12/10/22 12:50 AM  Result Value Ref Range   Lipase 48 11 - 51 U/L    Comment: Performed at [facility], [address] [place], [place] [address]  Lactic acid, plasma     Status: None   Collection Time: 12/10/22 12:50 AM  Result Value Ref Range   Lactic Acid, Venous 1.2 0.5 - 1.9 mmol/L    Comment: Performed at [facility], [address] [place], [place] [address]   CT ABDOMEN PELVIS WO CONTRAST  Result Date: 12/10/2022 CLINICAL DATA:  Abdominal pain with nausea and vomiting. Recent history of diverticulitis. EXAM: CT ABDOMEN AND PELVIS WITHOUT CONTRAST TECHNIQUE: Multidetector CT imaging of the abdomen and pelvis was performed following the standard protocol without IV contrast. RADIATION DOSE REDUCTION: This exam was performed according to the departmental dose-optimization program which includes automated exposure control, adjustment of the mA and/or kV according to patient size and/or use of iterative reconstruction technique.  COMPARISON:  12/07/2022. FINDINGS: Lower chest: The heart is normal in size and there is a small pericardial effusion. Mild atelectasis is  present at the lung bases. Hepatobiliary: A subcentimeter hypodensity is present in the left lobe of the liver, statistically most likely representing cysts or hemangioma. Stones are present within the gallbladder. No biliary ductal dilatation. Pancreas: Unremarkable. No pancreatic ductal dilatation or surrounding inflammatory changes. Spleen: Normal in size without focal abnormality. Adrenals/Urinary Tract: The adrenal glands are within normal limits. There are bilateral renal cysts and calculi. No hydronephrosis bilaterally. The bladder is unremarkable. Stomach/Bowel: Stomach is within normal limits. Appendix appears normal. No evidence of bowel wall thickening, distention, or inflammatory changes. No free air or pneumatosis. Multiple diverticula are present along the colon without evidence of diverticulitis. There is diastasis of the rectus abdominus with a broad-based umbilical hernia containing nonobstructed bowel. Vascular/Lymphatic: Aortic atherosclerosis. No enlarged abdominal or pelvic lymph nodes. Reproductive: Prostate is unremarkable. Other: A small amount of free fluid is present in the pelvis. Musculoskeletal: Degenerative changes are present in the thoracolumbar spine. No acute osseous abnormality. IMPRESSION: 1. No acute intra-abdominal process. 2. Diverticulosis without diverticulitis. 3. Bilateral nephrolithiasis. 4. Small amount of free fluid in the pelvis. 5. Aortic atherosclerosis. Electronically Signed   By: Thornell Sartorius M.D.   On: 12/10/2022 03:18   DG Chest Port 1 View  Result Date: 12/10/2022 CLINICAL DATA:  Vomiting EXAM: PORTABLE CHEST 1 VIEW COMPARISON:  None Available. FINDINGS: The heart size and mediastinal contours are within normal limits. Both lungs are clear. The visualized skeletal structures are unremarkable. IMPRESSION: No active  disease. Electronically Signed   By: Helyn Numbers M.D.   On: 12/10/2022 00:40   ECHOCARDIOGRAM COMPLETE  Result Date: 12/08/2022    ECHOCARDIOGRAM REPORT   Patient Name:   Karl Robinson Date of Exam: 12/08/2022 Medical Rec #:  [id number]      Height:       69.0 in Accession #:    [id number]     Weight:       192.2 lb Date of Birth:  Aug 23, 1950      BSA:          2.032 m Patient Age:    71 years       BP:           171/95 mmHg Patient Gender: M              HR:           74 bpm. Exam Location:  Inpatient Procedure: 2D Echo, Cardiac Doppler and Color Doppler Indications:    Atrial Fibrillation I48.91  History:        Patient has prior history of Echocardiogram examinations, most                 recent 09/29/2022. CHF, Arrythmias:Atrial Fibrillation,                 Signs/Symptoms:Dyspnea; Risk Factors:Diabetes.  Sonographer:    Darlys Gales Referring Phys: [phone] DEBBY CROSLEY IMPRESSIONS  1. Left ventricular ejection fraction, by estimation, is 60 to 65%. The left ventricle has normal function. The left ventricle has no regional wall motion abnormalities. There is mild concentric left ventricular hypertrophy. Left ventricular diastolic parameters are consistent with Grade II diastolic dysfunction (pseudonormalization). Elevated left atrial pressure.  2. Right ventricular systolic function is normal. The right ventricular size is normal. There is mildly elevated pulmonary artery systolic pressure.  3. Left atrial size was mild to moderately dilated.  4. The mitral valve is normal in structure. Moderate mitral valve regurgitation. No evidence of mitral stenosis.  5. Tricuspid  valve regurgitation is mild to moderate.  6. The aortic valve is tricuspid. Aortic valve regurgitation is not visualized. No aortic stenosis is present.  7. The inferior vena cava is normal in size with greater than 50% respiratory variability, suggesting right atrial pressure of 3 mmHg. Comparison(s): No significant change from prior study.  Prior images reviewed side by side. Pericardial effusion is even smaller, probably physiological. FINDINGS  Left Ventricle: Left ventricular ejection fraction, by estimation, is 60 to 65%. The left ventricle has normal function. The left ventricle has no regional wall motion abnormalities. The left ventricular internal cavity size was normal in size. There is  mild concentric left ventricular hypertrophy. Left ventricular diastolic parameters are consistent with Grade II diastolic dysfunction (pseudonormalization). Elevated left atrial pressure. Right Ventricle: The right ventricular size is normal. No increase in right ventricular wall thickness. Right ventricular systolic function is normal. There is mildly elevated pulmonary artery systolic pressure. The tricuspid regurgitant velocity is 3.09  m/s, and with an assumed right atrial pressure of 3 mmHg, the estimated right ventricular systolic pressure is 41.2 mmHg. Left Atrium: Left atrial size was mild to moderately dilated. Right Atrium: Right atrial size was normal in size. Pericardium: Trivial pericardial effusion is present. Mitral Valve: The mitral valve is normal in structure. Moderate mitral valve regurgitation, with posteriorly-directed jet. No evidence of mitral valve stenosis. Tricuspid Valve: The tricuspid valve is normal in structure. Tricuspid valve regurgitation is mild to moderate. No evidence of tricuspid stenosis. Aortic Valve: The aortic valve is tricuspid. Aortic valve regurgitation is not visualized. No aortic stenosis is present. Aortic valve mean gradient measures 6.0 mmHg. Aortic valve peak gradient measures 9.5 mmHg. Aortic valve area, by VTI measures 1.91 cm. Pulmonic Valve: The pulmonic valve was normal in structure. Pulmonic valve regurgitation is not visualized. No evidence of pulmonic stenosis. Aorta: The aortic root is normal in size and structure. Venous: The inferior vena cava is normal in size with greater than 50% respiratory  variability, suggesting right atrial pressure of 3 mmHg. IAS/Shunts: No atrial level shunt detected by color flow Doppler.  LEFT VENTRICLE PLAX 2D LVIDd:         5.30 cm   Diastology LVIDs:         3.70 cm   LV e' medial:    4.57 cm/s LV PW:         1.50 cm   LV E/e' medial:  21.6 LV IVS:        1.20 cm   LV e' lateral:   5.44 cm/s LVOT diam:     1.80 cm   LV E/e' lateral: 18.2 LV SV:         66 LV SV Index:   33 LVOT Area:     2.54 cm  RIGHT VENTRICLE RV S prime:     16.30 cm/s TAPSE (M-mode): 2.4 cm LEFT ATRIUM             Index        RIGHT ATRIUM           Index LA Vol (A2C):   67.9 ml 33.42 ml/m  RA Area:     17.90 cm LA Vol (A4C):   82.5 ml 40.61 ml/m  RA Volume:   45.60 ml  22.45 ml/m LA Biplane Vol: 77.0 ml 37.90 ml/m  AORTIC VALVE AV Area (Vmax):    1.88 cm AV Area (Vmean):   1.72 cm AV Area (VTI):     1.91 cm AV Vmax:  154.00 cm/s AV Vmean:          112.000 cm/s AV VTI:            0.346 m AV Peak Grad:      9.5 mmHg AV Mean Grad:      6.0 mmHg LVOT Vmax:         114.00 cm/s LVOT Vmean:        75.800 cm/s LVOT VTI:          0.260 m LVOT/AV VTI ratio: 0.75 MITRAL VALVE               TRICUSPID VALVE MV Area (PHT): 4.21 cm    TR Peak grad:   38.2 mmHg MV Decel Time: 180 msec    TR Vmax:        309.00 cm/s MV E velocity: 98.85 cm/s MV A velocity: 69.40 cm/s  SHUNTS MV E/A ratio:  1.42        Systemic VTI:  0.26 m                            Systemic Diam: 1.80 cm Mihai Croitoru MD Electronically signed by Thurmon Fair MD Signature Date/Time: 12/08/2022/4:32:42 PM    Final     Pending Labs Unresulted Labs (From admission, onward)     Start     Ordered   12/11/22 0500  Comprehensive metabolic panel  Tomorrow morning,   R        12/10/22 0826   12/11/22 0500  CBC  Tomorrow morning,   R        12/10/22 0826   12/10/22 0525  Culture, blood (routine x 2)  BLOOD CULTURE X 2,   R (with STAT occurrences)      12/10/22 0524   12/10/22 0020  Lactic acid, plasma  Now then every 2 hours,   R  (with STAT occurrences)      12/10/22 0021            Vitals/Pain Today's Vitals   12/10/22 0800 12/10/22 0822 12/10/22 0830 12/10/22 0845  BP:  (!) 168/69 (!) 148/74 (!) 161/78  Pulse: 83  79 81  Resp: 16  17 15   Temp:  98.9 F (37.2 C)    TempSrc:  Oral    SpO2: 98%  97% 100%  Weight:      Height:      PainSc:  0-No pain      Isolation Precautions No active isolations  Medications Medications  amoxicillin-clavulanate (AUGMENTIN) 500-125 MG per tablet 1 tablet (1 tablet Oral Given 12/10/22 0909)  metoprolol tartrate (LOPRESSOR) tablet 50 mg (50 mg Oral Given 12/10/22 0910)  rosuvastatin (CRESTOR) tablet 10 mg (has no administration in time range)  apixaban (ELIQUIS) tablet 5 mg (has no administration in time range)  ferrous sulfate tablet 325 mg (has no administration in time range)  acetaminophen (TYLENOL) tablet 650 mg (has no administration in time range)    Or  acetaminophen (TYLENOL) suppository 650 mg (has no administration in time range)  traZODone (DESYREL) tablet 25 mg (has no administration in time range)  docusate sodium (COLACE) capsule 100 mg (100 mg Oral Given 12/10/22 0916)  polyethylene glycol (MIRALAX / GLYCOLAX) packet 17 g (has no administration in time range)  ondansetron (ZOFRAN) tablet 4 mg (has no administration in time range)    Or  ondansetron (ZOFRAN) injection 4 mg (has no administration in time range)  albuterol (PROVENTIL) (2.5  MG/3ML) 0.083% nebulizer solution 2.5 mg (has no administration in time range)  metoprolol tartrate (LOPRESSOR) injection 5 mg (has no administration in time range)  pantoprazole (PROTONIX) injection 40 mg (40 mg Intravenous Not Given 12/10/22 0915)  fentaNYL (SUBLIMAZE) injection 50 mcg (50 mcg Intravenous Given 12/10/22 0028)  ondansetron (ZOFRAN) injection 4 mg (4 mg Intravenous Given 12/10/22 0030)  fentaNYL (SUBLIMAZE) injection 50 mcg (50 mcg Intravenous Given 12/10/22 0126)  metoprolol tartrate (LOPRESSOR)  injection 2.5 mg (2.5 mg Intravenous Given 12/10/22 0307)  morphine (PF) 4 MG/ML injection 4 mg (4 mg Intravenous Given 12/10/22 0315)  metoprolol tartrate (LOPRESSOR) injection 5 mg (5 mg Intravenous Given 12/10/22 0411)  pantoprazole (PROTONIX) injection 40 mg (40 mg Intravenous Given 12/10/22 0408)  morphine (PF) 4 MG/ML injection 4 mg (4 mg Intravenous Given 12/10/22 0409)  potassium chloride SA (KLOR-CON M) CR tablet 40 mEq (40 mEq Oral Given 12/10/22 0912)    Mobility walks

## 2022-12-10 NOTE — Discharge Summary (Signed)
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 Presents to [employer] due to abdominal pain x 1 day. Pt was found to have evidence of mild diverticulitis on CT abd. Pt was unable to tolerate PO while in ED, thus was admitted for further workup. Pt was also found to have new afib.   On 5/22, patient was very eager to be discharged as he had outpatient EGD scheduled at the [facility] for the morning.  Patient denies any abdominal pain, nausea/vomiting, diarrhea, fever/chills after extensive questioning with his wife at bedside.  Was also noted to be in sinus rhythm, rate controlled.  After multiple education about the need to stay, patient was adamant he wanted to be discharged so as he can meet up his fists HD outpatient session.  Patient completely denied all symptoms, was able to ambulate the hallway back-and-forth.  Tolerated his advanced diet without any issues.  Advised patient to go to  the nearest ED if his abdominal cramping comes back again or if he notices any significant palpitations, chest pain.  Patient and wife verbalized understanding.  Wife was going to drive patient to get his first HD session at the [place] in [place].  Assessment and Plan:  Acute diverticulitis Noted to be afebrile, with no leukocytosis Patient denied any abdominal pain, nausea/vomiting, was able to tolerate his diet Discharged patient on p.o. Augmentin Follow-up with PCP   Atrial fibrillation with RVR New diagnosis Rate controlled on 5/22, telemetry showing normal sinus rhythm Patient refused cardiology consult here and stated he had a cardiologist back at the [facility] of which he will make an appointment with a follow-up because he really wanted to go for his first dialysis session at the [facility] 2D echo reviewed. Normal LVEF, no WMA, normal RV function and size Discharged on metoprolol twice daily, Eliquis Advised to follow-up with cardiology  Low TSH PCP to follow free t4   Chronic kidney disease, stage 5 (severe) (HCC)/ESRD First outpatient HD session scheduled on 5/22 at the [facility], patient eager to be discharged   Hyperlipidemia Continue Crestor   Type 2 diabetes mellitus with chronic kidney disease (HCC) Continue home regimen   Gout Continue allopurinol, renally dosed   Hypokalemia Replaced   Hypomagnesemia Replaced      Pain control -  Controlled Substance Reporting System database was reviewed. and patient was instructed, not to drive, operate heavy machinery, perform activities at heights, swimming or participation in water activities or provide baby-sitting services while on Pain, Sleep and  Anxiety Medications; until their outpatient Physician has advised to do so again. Also recommended to not to take more than prescribed Pain, Sleep and Anxiety Medications.   Consultants: None Procedures performed: None Disposition: Home Diet recommendation:  Renal  diet    DISCHARGE MEDICATION: Allergies as of 12/09/2022       Reactions   Spironolactone Other (See Comments)   Gynecomastia   Hydromorphone Hcl    REACTION: UNSPECIFIED   Oxycodone Hcl    REACTION: itch        Medication List     STOP taking these medications    aspirin 81 MG tablet   cloNIDine 0.3 MG tablet Commonly known as: CATAPRES       TAKE these medications    acetaminophen 500 MG tablet Commonly known as: TYLENOL Take 500 mg by mouth 2 (two) times daily.   allopurinol 100 MG tablet Commonly known as: ZYLOPRIM Take 1 tablet (100 mg total) by mouth 3 (three) times a week. Take after HD What changed:  medication strength how much to take when to take this additional instructions   amLODipine 10 MG tablet Commonly known as: NORVASC Take 10 mg by mouth daily.   amoxicillin-clavulanate 500-125 MG tablet Commonly known as: Augmentin Take 1 tablet by mouth 2 (two) times daily for 7 doses.   apixaban 5 MG Tabs tablet Commonly known as: ELIQUIS Take 1 tablet (5 mg total) by mouth 2 (two) times daily.   cloNIDine 0.3 mg/24hr patch Commonly known as: CATAPRES - Dosed in mg/24 hr Place 0.3 mg onto the skin once a week.   dutasteride 0.5 MG capsule Commonly known as: AVODART Take 0.5 mg by mouth daily.   ferrous sulfate 325 (65 FE) MG tablet Take 325 mg by mouth every Monday, Wednesday, and Friday.   finasteride 5 MG tablet Commonly known as: PROSCAR Take 5 mg by mouth daily.   furosemide 40 MG tablet Commonly known as: Lasix Take 1 tablet (40 mg total) by mouth 2 (two) times daily. What changed: when to take this   lisinopril 40 MG tablet Commonly known as: ZESTRIL Take 40 mg by mouth daily.   metoprolol tartrate 50 MG tablet Commonly known as: LOPRESSOR Take 1 tablet (50 mg total) by mouth 2 (two) times daily.   MULTIVITAMIN PO Take 1 tablet by mouth in the morning.   pantoprazole 40 MG tablet Commonly known as: PROTONIX Take 1  tablet (40 mg total) by mouth daily.   potassium chloride SA 20 MEQ tablet Commonly known as: KLOR-CON M Take 20 mEq by mouth 2 (two) times daily.   prazosin 2 MG capsule Commonly known as: MINIPRESS Take 4 mg by mouth at bedtime.   rosuvastatin 10 MG tablet Commonly known as: CRESTOR Take 1 tablet (10 mg total) by mouth daily.   traMADol 50 MG tablet Commonly known as: ULTRAM Take 50 mg by mouth every 6 (six) hours as needed for moderate pain.   VITAMIN D3 PO Take 2,000 Units by mouth daily.        Discharge Exam: Filed Weights   12/07/22 2110  Weight: 87.2 kg   General: NAD  Cardiovascular: S1, S2 present Respiratory: CTAB Abdomen: Soft, nontender, nondistended, bowel sounds present Musculoskeletal: No bilateral pedal edema noted Skin: Normal Psychiatry: Normal mood   Condition at discharge: stable  The results of significant diagnostics from this hospitalization (including imaging, microbiology, ancillary and laboratory) are listed below for reference.   Imaging Studies: CT ABDOMEN PELVIS WO CONTRAST  Result Date: 12/10/2022 CLINICAL DATA:  Abdominal pain with nausea and vomiting. Recent history of diverticulitis. EXAM: CT ABDOMEN AND PELVIS WITHOUT CONTRAST TECHNIQUE: Multidetector CT imaging of the abdomen and pelvis was performed following the standard protocol without IV contrast. RADIATION DOSE REDUCTION: This exam was performed according to the departmental dose-optimization program which includes automated exposure control, adjustment of the mA and/or kV according to patient size and/or use of iterative reconstruction technique. COMPARISON:  12/07/2022. FINDINGS: Lower chest: The heart is normal in size and there is a small pericardial effusion. Mild atelectasis is present at the lung bases. Hepatobiliary: A subcentimeter hypodensity is present in the left lobe of the liver, statistically most likely representing cysts or hemangioma. Stones are present within  the gallbladder. No biliary ductal dilatation. Pancreas: Unremarkable. No pancreatic ductal dilatation or surrounding inflammatory changes. Spleen: Normal in size without focal abnormality. Adrenals/Urinary Tract: The adrenal glands are within normal limits. There are bilateral renal cysts and calculi. No hydronephrosis bilaterally. The bladder is unremarkable. Stomach/Bowel: Stomach is within normal limits. Appendix appears normal. No evidence of bowel wall thickening, distention, or inflammatory changes. No free air or pneumatosis. Multiple diverticula are present along the colon without evidence of diverticulitis. There is diastasis of the rectus abdominus with a broad-based umbilical hernia containing nonobstructed bowel. Vascular/Lymphatic: Aortic atherosclerosis. No enlarged abdominal or pelvic lymph nodes. Reproductive: Prostate is unremarkable. Other: A small amount of free fluid is present in the pelvis. Musculoskeletal: Degenerative changes are present in the thoracolumbar spine. No acute osseous abnormality. IMPRESSION: 1. No acute intra-abdominal process. 2. Diverticulosis without diverticulitis. 3. Bilateral nephrolithiasis. 4. Small amount of free fluid in the pelvis. 5. Aortic atherosclerosis. Electronically Signed   By: Thornell Sartorius M.D.   On: 12/10/2022 03:18   DG Chest Port 1 View  Result Date: 12/10/2022 CLINICAL DATA:  Vomiting EXAM: PORTABLE CHEST 1 VIEW COMPARISON:  None Available. FINDINGS: The heart size and mediastinal contours are within normal limits. Both lungs are clear. The visualized skeletal structures are unremarkable. IMPRESSION: No active disease. Electronically Signed   By: Helyn Numbers M.D.   On: 12/10/2022 00:40   ECHOCARDIOGRAM COMPLETE  Result Date: 12/08/2022    ECHOCARDIOGRAM REPORT   Patient Name:   ARTH HEDINGER Date of Exam: 12/08/2022 Medical Rec #:  [id number]      Height:       69.0 in Accession #:    [id number]     Weight:       192.2 lb Date of Birth:   [birth year]      BSA:          2.032 m Patient Age:    71 years       BP:           171/95 mmHg Patient Gender: M              HR:           74 bpm. Exam Location:  Inpatient Procedure: 2D Echo, Cardiac Doppler and Color Doppler Indications:    Atrial Fibrillation I48.91  History:        Patient has prior history of Echocardiogram examinations, most                 recent 09/29/2022. CHF, Arrythmias:Atrial Fibrillation,                 Signs/Symptoms:Dyspnea; Risk Factors:Diabetes.  Sonographer:    Darlys Gales Referring Phys: [phone] DEBBY CROSLEY IMPRESSIONS  1. Left ventricular ejection  fraction, by estimation, is 60 to 65%. The left ventricle has normal function. The left ventricle has no regional wall motion abnormalities. There is mild concentric left ventricular hypertrophy. Left ventricular diastolic parameters are consistent with Grade II diastolic dysfunction (pseudonormalization). Elevated left atrial pressure.  2. Right ventricular systolic function is normal. The right ventricular size is normal. There is mildly elevated pulmonary artery systolic pressure.  3. Left atrial size was mild to moderately dilated.  4. The mitral valve is normal in structure. Moderate mitral valve regurgitation. No evidence of mitral stenosis.  5. Tricuspid valve regurgitation is mild to moderate.  6. The aortic valve is tricuspid. Aortic valve regurgitation is not visualized. No aortic stenosis is present.  7. The inferior vena cava is normal in size with greater than 50% respiratory variability, suggesting right atrial pressure of 3 mmHg. Comparison(s): No significant change from prior study. Prior images reviewed side by side. Pericardial effusion is even smaller, probably physiological. FINDINGS  Left Ventricle: Left ventricular ejection fraction, by estimation, is 60 to 65%. The left ventricle has normal function. The left ventricle has no regional wall motion abnormalities. The left ventricular internal cavity size was normal  in size. There is  mild concentric left ventricular hypertrophy. Left ventricular diastolic parameters are consistent with Grade II diastolic dysfunction (pseudonormalization). Elevated left atrial pressure. Right Ventricle: The right ventricular size is normal. No increase in right ventricular wall thickness. Right ventricular systolic function is normal. There is mildly elevated pulmonary artery systolic pressure. The tricuspid regurgitant velocity is 3.09  m/s, and with an assumed right atrial pressure of 3 mmHg, the estimated right ventricular systolic pressure is 41.2 mmHg. Left Atrium: Left atrial size was mild to moderately dilated. Right Atrium: Right atrial size was normal in size. Pericardium: Trivial pericardial effusion is present. Mitral Valve: The mitral valve is normal in structure. Moderate mitral valve regurgitation, with posteriorly-directed jet. No evidence of mitral valve stenosis. Tricuspid Valve: The tricuspid valve is normal in structure. Tricuspid valve regurgitation is mild to moderate. No evidence of tricuspid stenosis. Aortic Valve: The aortic valve is tricuspid. Aortic valve regurgitation is not visualized. No aortic stenosis is present. Aortic valve mean gradient measures 6.0 mmHg. Aortic valve peak gradient measures 9.5 mmHg. Aortic valve area, by VTI measures 1.91 cm. Pulmonic Valve: The pulmonic valve was normal in structure. Pulmonic valve regurgitation is not visualized. No evidence of pulmonic stenosis. Aorta: The aortic root is normal in size and structure. Venous: The inferior vena cava is normal in size with greater than 50% respiratory variability, suggesting right atrial pressure of 3 mmHg. IAS/Shunts: No atrial level shunt detected by color flow Doppler.  LEFT VENTRICLE PLAX 2D LVIDd:         5.30 cm   Diastology LVIDs:         3.70 cm   LV e' medial:    4.57 cm/s LV PW:         1.50 cm   LV E/e' medial:  21.6 LV IVS:        1.20 cm   LV e' lateral:   5.44 cm/s LVOT diam:      1.80 cm   LV E/e' lateral: 18.2 LV SV:         66 LV SV Index:   33 LVOT Area:     2.54 cm  RIGHT VENTRICLE RV S prime:     16.30 cm/s TAPSE (M-mode): 2.4 cm LEFT ATRIUM  Index        RIGHT ATRIUM           Index LA Vol (A2C):   67.9 ml 33.42 ml/m  RA Area:     17.90 cm LA Vol (A4C):   82.5 ml 40.61 ml/m  RA Volume:   45.60 ml  22.45 ml/m LA Biplane Vol: 77.0 ml 37.90 ml/m  AORTIC VALVE AV Area (Vmax):    1.88 cm AV Area (Vmean):   1.72 cm AV Area (VTI):     1.91 cm AV Vmax:           154.00 cm/s AV Vmean:          112.000 cm/s AV VTI:            0.346 m AV Peak Grad:      9.5 mmHg AV Mean Grad:      6.0 mmHg LVOT Vmax:         114.00 cm/s LVOT Vmean:        75.800 cm/s LVOT VTI:          0.260 m LVOT/AV VTI ratio: 0.75 MITRAL VALVE               TRICUSPID VALVE MV Area (PHT): 4.21 cm    TR Peak grad:   38.2 mmHg MV Decel Time: 180 msec    TR Vmax:        309.00 cm/s MV E velocity: 98.85 cm/s MV A velocity: 69.40 cm/s  SHUNTS MV E/A ratio:  1.42        Systemic VTI:  0.26 m                            Systemic Diam: 1.80 cm Rachelle Hora Croitoru MD Electronically signed by Thurmon Fair MD Signature Date/Time: 12/08/2022/4:32:42 PM    Final    CT ABDOMEN PELVIS WO CONTRAST  Result Date: 12/07/2022 CLINICAL DATA:  Abdominal pain.  Seven episodes of vomiting. EXAM: CT ABDOMEN AND PELVIS WITHOUT CONTRAST TECHNIQUE: Multidetector CT imaging of the abdomen and pelvis was performed following the standard protocol without IV contrast. RADIATION DOSE REDUCTION: This exam was performed according to the departmental dose-optimization program which includes automated exposure control, adjustment of the mA and/or kV according to patient size and/or use of iterative reconstruction technique. COMPARISON:  None Available. FINDINGS: Lower chest: Small pericardial effusion. Hepatobiliary: Tiny hypodensity in the superior left hepatic lobe is too small to definitively characterize but likely represents a benign cyst  or hemangioma. Unremarkable gallbladder and biliary tree. Pancreas: Unremarkable. Spleen: Unremarkable. Adrenals/Urinary Tract: Normal adrenal glands. Nonobstructing bilateral nephrolithiasis. No ureteral calculi or hydronephrosis. Diffuse bladder wall thickening. Stomach/Bowel: Normal caliber large and small bowel. Extensive sigmoid diverticulosis with subtle stranding about a few sigmoid diverticula. No appendicitis. Stomach is within normal limits. Vascular/Lymphatic: Aortic atherosclerosis. No enlarged abdominal or pelvic lymph nodes. Reproductive: Enlarged prostate. Other: Small volume low-density free fluid in the pelvis. No free intraperitoneal air. Musculoskeletal: No acute osseous abnormality. IMPRESSION: 1. Mild uncomplicated sigmoid diverticulitis. 2. Nonobstructing bilateral nephrolithiasis. 3. Diffuse bladder wall thickening, possibly reflecting chronic outlet obstruction given the enlarged prostate. 4. Small volume free fluid in the pelvis. Aortic Atherosclerosis (ICD10-I70.0). Electronically Signed   By: Minerva Fester M.D.   On: 12/07/2022 22:41   DG Chest Port 1 View  Result Date: 12/07/2022 CLINICAL DATA:  Shortness of breath EXAM: PORTABLE CHEST 1 VIEW COMPARISON:  09/28/2022 FINDINGS: The heart size and mediastinal  contours are within normal limits. Aortic atherosclerosis. Both lungs are clear. The visualized skeletal structures are unremarkable. IMPRESSION: No active disease. Electronically Signed   By: Jasmine Pang M.D.   On: 12/07/2022 21:50    Microbiology: Results for orders placed or performed during the hospital encounter of 09/28/22  Resp panel by RT-PCR (RSV, Flu A&B, Covid) Anterior Nasal Swab     Status: None   Collection Time: 09/28/22 10:06 PM   Specimen: Anterior Nasal Swab  Result Value Ref Range Status   SARS Coronavirus 2 by RT PCR NEGATIVE NEGATIVE Final   Influenza A by PCR NEGATIVE NEGATIVE Final   Influenza B by PCR NEGATIVE NEGATIVE Final    Comment:  (NOTE) The Xpert Xpress SARS-CoV-2/FLU/RSV plus assay is intended as an aid in the diagnosis of influenza from Nasopharyngeal swab specimens and should not be used as a sole basis for treatment. Nasal washings and aspirates are unacceptable for Xpert Xpress SARS-CoV-2/FLU/RSV testing.  Fact Sheet for Patients: BloggerCourse.com  Fact Sheet for Healthcare Providers: SeriousBroker.it  This test is not yet approved or cleared by the [place] FDA and has been authorized for detection and/or diagnosis of SARS-CoV-2 by FDA under an Emergency Use Authorization (EUA). This EUA will remain in effect (meaning this test can be used) for the duration of the COVID-19 declaration under Section 564(b)(1) of the Act, 21 U.S.C. section 360bbb-3(b)(1), unless the authorization is terminated or revoked.     Resp Syncytial Virus by PCR NEGATIVE NEGATIVE Final    Comment: (NOTE) Fact Sheet for Patients: BloggerCourse.com  Fact Sheet for Healthcare Providers: SeriousBroker.it  This test is not yet approved or cleared by the [place] FDA and has been authorized for detection and/or diagnosis of SARS-CoV-2 by FDA under an Emergency Use Authorization (EUA). This EUA will remain in effect (meaning this test can be used) for the duration of the COVID-19 declaration under Section 564(b)(1) of the Act, 21 U.S.C. section 360bbb-3(b)(1), unless the authorization is terminated or revoked.  Performed at [facility], [address] [place], [place] [address]     Labs: CBC: Recent Labs  Lab 12/07/22 2124 12/10/22 0050  WBC 7.0 7.2  NEUTROABS 6.2 6.5  HGB 12.9* 12.8*  HCT 38.4* 37.9*  MCV 79.7* 79.1*  PLT 203 180   Basic Metabolic Panel: Recent Labs  Lab 12/07/22 2124 12/08/22 0633 12/09/22 0556 12/10/22 0050 12/10/22 1142  NA 140  --  139 138  --   K 3.3*  --  3.5 2.7*  3.3*  CL 104  --  105 103  --   CO2 20*  --  25 22  --   GLUCOSE 156*  --  110* 107*  --   BUN 41*  --  46* 29*  --   CREATININE 4.91*  --  5.32* 3.73*  --   CALCIUM 9.2  --  8.2* 7.9*  --   MG  --  1.5* 2.9*  --   --    Liver Function Tests: Recent Labs  Lab 12/07/22 2124 12/09/22 0556 12/10/22 0050  AST 27 21 29   ALT 15 13 17   ALKPHOS 106 78 92  BILITOT 0.9 0.4 0.8  PROT 6.9 5.4* 6.2*  ALBUMIN 3.8 2.8* 3.3*   CBG: Recent Labs  Lab 12/08/22 0758 12/08/22 1150 12/08/22 1820 12/08/22 2149 12/09/22 0719  GLUCAP 124* 110* 106* 118* 104*    Discharge time spent: greater than 30 minutes.  Signed: Briant Cedar, MD [facility] 12/10/2022

## 2022-12-10 NOTE — Progress Notes (Signed)
NEW ADMISSION NOTE New Admission Note:   Arrival Method: Patient arrived from [facility] ED via [facility] Mental Orientation: Alert and oriented x 4. Telemetry: 64M-03 Assessment: Completed Skin: Warm and dry. IV: L AC and L H both SL Pain: denies any pain. Tubes: N/A Safety Measures: Safety Fall Prevention Plan has been given, discussed and implemented. Admission: Completed [facility] Orientation: Patient has been orientated to the room, unit and staff.  Family: NOne  Orders have been reviewed and implemented. Will continue to monitor the patient. Call light has been placed within reach and bed alarm has been activated.   Arvilla Meres, RN

## 2022-12-10 NOTE — ED Provider Notes (Signed)
[place] EMERGENCY DEPARTMENT AT [facility] Provider Note   CSN: [id number] Arrival date & time: 12/09/22  2345     History  Chief Complaint  Patient presents with   Abdominal Pain    Karl Robinson is a 72 y.o. male.  The history is provided by the patient and medical records.  Abdominal Pain Karl Robinson is a 72 y.o. male who presents to the Emergency Department complaining of abdominal pain.  He presents to the emergency department for evaluation of abdominal pain.  He was just discharged from the hospital today and then went to dialysis for his first session that was completed at 330.  He states that dialysis went well and he was feeling well.  And then around 6 when he woke up from his nap he had severe cramping abdominal pain with associated nausea and vomiting.  No fever.  He does report chills.  No chest pain or difficulty breathing.  He was recently admitted for diverticulitis and new onset A-fib.  He states that he is compliant with his medications but has not taken his evening medications yet.       Home Medications Prior to Admission medications   Medication Sig Start Date End Date Taking? Authorizing Provider  acetaminophen (TYLENOL) 500 MG tablet Take 500 mg by mouth 2 (two) times daily.   Yes [provider]  amLODipine (NORVASC) 10 MG tablet Take 10 mg by mouth daily.   Yes [provider]  amoxicillin-clavulanate (AUGMENTIN) 500-125 MG tablet Take 1 tablet by mouth 2 (two) times daily for 7 doses. 12/09/22 12/13/22 Yes Briant Cedar, MD  Cholecalciferol (VITAMIN D3 PO) Take 2,000 Units by mouth daily.   Yes [provider]  dutasteride (AVODART) 0.5 MG capsule Take 0.5 mg by mouth daily.   Yes [provider]  ferrous sulfate 325 (65 FE) MG tablet Take 325 mg by mouth every Monday, Wednesday, and Friday.   Yes [provider]  finasteride (PROSCAR) 5 MG tablet Take 5 mg by mouth daily.   Yes [provider]  furosemide (LASIX) 40 MG tablet Take 40 mg by mouth daily.   Yes [provider]  lisinopril (ZESTRIL) 40 MG tablet Take 40 mg by mouth daily. 12/03/22  Yes [provider]  Multiple Vitamin (MULTIVITAMIN PO) Take 1 tablet by mouth in the morning.   Yes [provider]  pantoprazole (PROTONIX) 40 MG tablet Take 1 tablet (40 mg total) by mouth daily. 10/02/22  Yes Rana Snare, DO  potassium chloride SA (KLOR-CON M) 20 MEQ tablet Take 20 mEq by mouth 2 (two) times daily.   Yes [provider]  prazosin (MINIPRESS) 2 MG capsule Take 4 mg by mouth at bedtime.   Yes [provider]  rosuvastatin (CRESTOR) 10 MG tablet Take 1 tablet (10 mg total) by mouth daily. 10/02/22  Yes Rana Snare, DO  traMADol (ULTRAM) 50 MG tablet Take 50 mg by mouth every 6 (six) hours as needed for moderate pain. 12/06/18  Yes [provider]  allopurinol (ZYLOPRIM) 100 MG tablet Take 1 tablet (100 mg total) by mouth 3 (three) times a week. Take after HD 12/09/22 01/08/23  Briant Cedar, MD  apixaban (ELIQUIS) 5 MG TABS tablet Take 1 tablet (5 mg total) by mouth 2 (two) times daily. 12/09/22 01/08/23  Briant Cedar, MD  cloNIDine (CATAPRES - DOSED IN MG/24 HR) 0.3 mg/24hr patch Place 0.3 mg onto the skin once a week.  11/10/22   [provider]  furosemide (LASIX) 40 MG tablet Take 1 tablet (40 mg total) by mouth 2 (two) times daily. Patient taking differently: Take 40 mg by mouth daily. 10/01/22 12/08/22  Rana Snare, DO  metoprolol tartrate (LOPRESSOR) 50 MG tablet Take 1 tablet (50 mg total) by mouth 2 (two) times daily. 12/09/22 01/08/23  Briant Cedar, MD      Allergies    Spironolactone, Hydromorphone hcl, and Oxycodone hcl    Review of Systems   Review of Systems  Gastrointestinal:  Positive for abdominal pain.  All other systems reviewed and are negative.   Physical Exam Updated Vital Signs BP (!) 168/69   Pulse 83    Temp 98.9 F (37.2 C) (Oral)   Resp 16   Ht 5\' 9"  (1.753 m)   Wt 82.6 kg   SpO2 98%   BMI 26.88 kg/m  Physical Exam Vitals and nursing note reviewed.  Constitutional:      Appearance: He is well-developed.     Comments: Appears uncomfortable  HENT:     Head: Normocephalic and atraumatic.  Cardiovascular:     Rate and Rhythm: Normal rate and regular rhythm.     Heart sounds: No murmur heard. Pulmonary:     Effort: Pulmonary effort is normal. No respiratory distress.     Breath sounds: Normal breath sounds.  Abdominal:     Palpations: Abdomen is soft.     Tenderness: There is no guarding or rebound.     Comments: Moderate upper abdominal tenderness  Musculoskeletal:        General: No tenderness.  Skin:    General: Skin is warm and dry.  Neurological:     Mental Status: He is alert and oriented to person, place, and time.  Psychiatric:        Behavior: Behavior normal.     ED Results / Procedures / Treatments   Labs (all labs ordered are listed, but only abnormal results are displayed) Labs Reviewed  COMPREHENSIVE METABOLIC PANEL - Abnormal; Notable for the following components:      Result Value   Potassium 2.7 (*)    Glucose, Bld 107 (*)    BUN 29 (*)    Creatinine, Ser 3.73 (*)    Calcium 7.9 (*)    Total Protein 6.2 (*)    Albumin 3.3 (*)    GFR, Estimated 16 (*)    All other components within normal limits  CBC WITH DIFFERENTIAL/PLATELET - Abnormal; Notable for the following components:   Hemoglobin 12.8 (*)    HCT 37.9 (*)    MCV 79.1 (*)    Lymphs Abs 0.5 (*)    All other components within normal limits  CULTURE, BLOOD (ROUTINE X 2)  CULTURE, BLOOD (ROUTINE X 2)  LIPASE, BLOOD  LACTIC ACID, PLASMA  LACTIC ACID, PLASMA    EKG EKG Interpretation  Date/Time:  Thursday Dec 10 2022 00:35:31 EDT Ventricular Rate:  101 PR Interval:    QRS Duration: 86 QT Interval:  375 QTC Calculation: 444 R Axis:   84 Text Interpretation: Atrial fibrillation  Paired ventricular premature complexes Borderline right axis deviation Probable LVH with secondary repol abnrm Confirmed by Tilden Fossa [phone]) on 12/10/2022 1:05:26 AM  Radiology CT ABDOMEN PELVIS WO CONTRAST  Result Date: 12/10/2022 CLINICAL DATA:  Abdominal pain with nausea and vomiting. Recent history of diverticulitis. EXAM: CT ABDOMEN AND PELVIS WITHOUT CONTRAST TECHNIQUE: Multidetector CT imaging of the abdomen and pelvis was performed following the  standard protocol without IV contrast. RADIATION DOSE REDUCTION: This exam was performed according to the departmental dose-optimization program which includes automated exposure control, adjustment of the mA and/or kV according to patient size and/or use of iterative reconstruction technique. COMPARISON:  12/07/2022. FINDINGS: Lower chest: The heart is normal in size and there is a small pericardial effusion. Mild atelectasis is present at the lung bases. Hepatobiliary: A subcentimeter hypodensity is present in the left lobe of the liver, statistically most likely representing cysts or hemangioma. Stones are present within the gallbladder. No biliary ductal dilatation. Pancreas: Unremarkable. No pancreatic ductal dilatation or surrounding inflammatory changes. Spleen: Normal in size without focal abnormality. Adrenals/Urinary Tract: The adrenal glands are within normal limits. There are bilateral renal cysts and calculi. No hydronephrosis bilaterally. The bladder is unremarkable. Stomach/Bowel: Stomach is within normal limits. Appendix appears normal. No evidence of bowel wall thickening, distention, or inflammatory changes. No free air or pneumatosis. Multiple diverticula are present along the colon without evidence of diverticulitis. There is diastasis of the rectus abdominus with a broad-based umbilical hernia containing nonobstructed bowel. Vascular/Lymphatic: Aortic atherosclerosis. No enlarged abdominal or pelvic lymph nodes. Reproductive: Prostate  is unremarkable. Other: A small amount of free fluid is present in the pelvis. Musculoskeletal: Degenerative changes are present in the thoracolumbar spine. No acute osseous abnormality. IMPRESSION: 1. No acute intra-abdominal process. 2. Diverticulosis without diverticulitis. 3. Bilateral nephrolithiasis. 4. Small amount of free fluid in the pelvis. 5. Aortic atherosclerosis. Electronically Signed   By: Thornell Sartorius M.D.   On: 12/10/2022 03:18   DG Chest Port 1 View  Result Date: 12/10/2022 CLINICAL DATA:  Vomiting EXAM: PORTABLE CHEST 1 VIEW COMPARISON:  None Available. FINDINGS: The heart size and mediastinal contours are within normal limits. Both lungs are clear. The visualized skeletal structures are unremarkable. IMPRESSION: No active disease. Electronically Signed   By: Helyn Numbers M.D.   On: 12/10/2022 00:40   ECHOCARDIOGRAM COMPLETE  Result Date: 12/08/2022    ECHOCARDIOGRAM REPORT   Patient Name:   RENAE KORBER Date of Exam: 12/08/2022 Medical Rec #:  [id number]      Height:       69.0 in Accession #:    [id number]     Weight:       192.2 lb Date of Birth:  11-Jul-1951      BSA:          2.032 m Patient Age:    71 years       BP:           171/95 mmHg Patient Gender: M              HR:           74 bpm. Exam Location:  Inpatient Procedure: 2D Echo, Cardiac Doppler and Color Doppler Indications:    Atrial Fibrillation I48.91  History:        Patient has prior history of Echocardiogram examinations, most                 recent 09/29/2022. CHF, Arrythmias:Atrial Fibrillation,                 Signs/Symptoms:Dyspnea; Risk Factors:Diabetes.  Sonographer:    Darlys Gales Referring Phys: [phone] DEBBY CROSLEY IMPRESSIONS  1. Left ventricular ejection fraction, by estimation, is 60 to 65%. The left ventricle has normal function. The left ventricle has no regional wall motion abnormalities. There is mild concentric left ventricular hypertrophy. Left ventricular diastolic parameters are consistent with Grade  II diastolic dysfunction (pseudonormalization). Elevated left atrial pressure.  2. Right ventricular systolic function is normal. The right ventricular size is normal. There is mildly elevated pulmonary artery systolic pressure.  3. Left atrial size was mild to moderately dilated.  4. The mitral valve is normal in structure. Moderate mitral valve regurgitation. No evidence of mitral stenosis.  5. Tricuspid valve regurgitation is mild to moderate.  6. The aortic valve is tricuspid. Aortic valve regurgitation is not visualized. No aortic stenosis is present.  7. The inferior vena cava is normal in size with greater than 50% respiratory variability, suggesting right atrial pressure of 3 mmHg. Comparison(s): No significant change from prior study. Prior images reviewed side by side. Pericardial effusion is even smaller, probably physiological. FINDINGS  Left Ventricle: Left ventricular ejection fraction, by estimation, is 60 to 65%. The left ventricle has normal function. The left ventricle has no regional wall motion abnormalities. The left ventricular internal cavity size was normal in size. There is  mild concentric left ventricular hypertrophy. Left ventricular diastolic parameters are consistent with Grade II diastolic dysfunction (pseudonormalization). Elevated left atrial pressure. Right Ventricle: The right ventricular size is normal. No increase in right ventricular wall thickness. Right ventricular systolic function is normal. There is mildly elevated pulmonary artery systolic pressure. The tricuspid regurgitant velocity is 3.09  m/s, and with an assumed right atrial pressure of 3 mmHg, the estimated right ventricular systolic pressure is 41.2 mmHg. Left Atrium: Left atrial size was mild to moderately dilated. Right Atrium: Right atrial size was normal in size. Pericardium: Trivial pericardial effusion is present. Mitral Valve: The mitral valve is normal in structure. Moderate mitral valve regurgitation, with  posteriorly-directed jet. No evidence of mitral valve stenosis. Tricuspid Valve: The tricuspid valve is normal in structure. Tricuspid valve regurgitation is mild to moderate. No evidence of tricuspid stenosis. Aortic Valve: The aortic valve is tricuspid. Aortic valve regurgitation is not visualized. No aortic stenosis is present. Aortic valve mean gradient measures 6.0 mmHg. Aortic valve peak gradient measures 9.5 mmHg. Aortic valve area, by VTI measures 1.91 cm. Pulmonic Valve: The pulmonic valve was normal in structure. Pulmonic valve regurgitation is not visualized. No evidence of pulmonic stenosis. Aorta: The aortic root is normal in size and structure. Venous: The inferior vena cava is normal in size with greater than 50% respiratory variability, suggesting right atrial pressure of 3 mmHg. IAS/Shunts: No atrial level shunt detected by color flow Doppler.  LEFT VENTRICLE PLAX 2D LVIDd:         5.30 cm   Diastology LVIDs:         3.70 cm   LV e' medial:    4.57 cm/s LV PW:         1.50 cm   LV E/e' medial:  21.6 LV IVS:        1.20 cm   LV e' lateral:   5.44 cm/s LVOT diam:     1.80 cm   LV E/e' lateral: 18.2 LV SV:         66 LV SV Index:   33 LVOT Area:     2.54 cm  RIGHT VENTRICLE RV S prime:     16.30 cm/s TAPSE (M-mode): 2.4 cm LEFT ATRIUM             Index        RIGHT ATRIUM           Index LA Vol (A2C):   67.9 ml 33.42 ml/m  RA Area:  17.90 cm LA Vol (A4C):   82.5 ml 40.61 ml/m  RA Volume:   45.60 ml  22.45 ml/m LA Biplane Vol: 77.0 ml 37.90 ml/m  AORTIC VALVE AV Area (Vmax):    1.88 cm AV Area (Vmean):   1.72 cm AV Area (VTI):     1.91 cm AV Vmax:           154.00 cm/s AV Vmean:          112.000 cm/s AV VTI:            0.346 m AV Peak Grad:      9.5 mmHg AV Mean Grad:      6.0 mmHg LVOT Vmax:         114.00 cm/s LVOT Vmean:        75.800 cm/s LVOT VTI:          0.260 m LVOT/AV VTI ratio: 0.75 MITRAL VALVE               TRICUSPID VALVE MV Area (PHT): 4.21 cm    TR Peak grad:   38.2 mmHg MV  Decel Time: 180 msec    TR Vmax:        309.00 cm/s MV E velocity: 98.85 cm/s MV A velocity: 69.40 cm/s  SHUNTS MV E/A ratio:  1.42        Systemic VTI:  0.26 m                            Systemic Diam: 1.80 cm Mihai Croitoru MD Electronically signed by Thurmon Fair MD Signature Date/Time: 12/08/2022/4:32:42 PM    Final     Procedures Procedures    Medications Ordered in ED Medications  amoxicillin-clavulanate (AUGMENTIN) 500-125 MG per tablet 1 tablet (has no administration in time range)  metoprolol tartrate (LOPRESSOR) tablet 50 mg (has no administration in time range)  rosuvastatin (CRESTOR) tablet 10 mg (has no administration in time range)  apixaban (ELIQUIS) tablet 5 mg (has no administration in time range)  ferrous sulfate tablet 325 mg (has no administration in time range)  potassium chloride SA (KLOR-CON M) CR tablet 40 mEq (has no administration in time range)  acetaminophen (TYLENOL) tablet 650 mg (has no administration in time range)    Or  acetaminophen (TYLENOL) suppository 650 mg (has no administration in time range)  traZODone (DESYREL) tablet 25 mg (has no administration in time range)  docusate sodium (COLACE) capsule 100 mg (has no administration in time range)  polyethylene glycol (MIRALAX / GLYCOLAX) packet 17 g (has no administration in time range)  ondansetron (ZOFRAN) tablet 4 mg (has no administration in time range)    Or  ondansetron (ZOFRAN) injection 4 mg (has no administration in time range)  albuterol (PROVENTIL) (2.5 MG/3ML) 0.083% nebulizer solution 2.5 mg (has no administration in time range)  metoprolol tartrate (LOPRESSOR) injection 5 mg (has no administration in time range)  pantoprazole (PROTONIX) injection 40 mg (has no administration in time range)  fentaNYL (SUBLIMAZE) injection 50 mcg (50 mcg Intravenous Given 12/10/22 0028)  ondansetron (ZOFRAN) injection 4 mg (4 mg Intravenous Given 12/10/22 0030)  fentaNYL (SUBLIMAZE) injection 50 mcg (50 mcg  Intravenous Given 12/10/22 0126)  metoprolol tartrate (LOPRESSOR) injection 2.5 mg (2.5 mg Intravenous Given 12/10/22 0307)  morphine (PF) 4 MG/ML injection 4 mg (4 mg Intravenous Given 12/10/22 0315)  metoprolol tartrate (LOPRESSOR) injection 5 mg (5 mg Intravenous Given 12/10/22 0411)  pantoprazole (PROTONIX) injection 40  mg (40 mg Intravenous Given 12/10/22 0408)  morphine (PF) 4 MG/ML injection 4 mg (4 mg Intravenous Given 12/10/22 0409)    ED Course/ Medical Decision Making/ A&P                             Medical Decision Making Amount and/or Complexity of Data Reviewed Labs: ordered. Radiology: ordered.  Risk Prescription drug management. Decision regarding hospitalization.   Patient with history of ESRD on hemodialysis that was just started Wednesday as well as new onset A-fib here for evaluation of severe abdominal pain, vomiting.  Patient uncomfortable appearing on evaluation but no significant tenderness on examination.  Lactic acid is within normal limits.  CMP with hypokalemia, no EKG changes at this time-will not replace in setting of ESRD.  Patient significantly hypertensive at time of ED presentation, he did miss his evening metoprolol.  He was treated with metoprolol IV x 2 with improvement in his blood pressures.  He required multiple rounds of IV pain medications for pain control.  CT abdomen pelvis is without clear source for his pain.  Previously identified diverticulitis has since resolved.  He did develop a temperature to 100.3 in the emergency department.  Cultures were added onto his evaluation.  No clear evidence of infectious source at this time so antibiotics were not started.  Given patient's intractable pain, unclear source as well as low-grade temperature medicine consulted for admission for ongoing care.        Final Clinical Impression(s) / ED Diagnoses Final diagnoses:  Epigastric pain    Rx / DC Orders ED Discharge Orders     None         Tilden Fossa, MD 12/10/22 [phone]

## 2022-12-10 NOTE — ED Notes (Signed)
Patient resting quietly with eyes closed.  Respirations even and unlabored.  Will attempt lab work once patient is awake due to patient being in severe pain on arrival and for length of stay.

## 2022-12-10 NOTE — H&P (Signed)
History and Physical  Karl Robinson ZOX:[id number] DOB: [birth year] DOA: 12/09/2022  PCP: [facility]   Chief Complaint: vomiting, abd pain   HPI: Karl Robinson is a 72 y.o. male with medical history significant for CKD stage IV, congestive heart failure, diverticulosis who was discharged from [facility] on 5/22 after admission for acute diverticulitis.  During that hospital admission, he was also diagnosed with atrial fibrillation and started on oral metoprolol and Eliquis.  He was discharged from the hospital yesterday 5/22 and went directly to his first outpatient dialysis session.  Says that this went well, afterwards he picked up medications from the pharmacy and went home.  Once he got home, he had sudden onset of severe epigastric cramping abdominal pain, as well as vomiting.  Subjective fevers as well.  This felt very similar to when he initially had acute diverticulitis few days ago.  He tried taking his prescribed Augmentin thinking it might make him feel better, but he vomited that up as well.  He therefore decided to come back to the ER for evaluation.  ED Course: On evaluation in the ER, he was very hypertensive with blood pressure 220/100, tachycardic as well.  Lab work was done which was relatively stable, showing some mild hypokalemia at 2.7.  He was given IV Zofran, IV Lasix, IV fentanyl and later some IV morphine.  He was also given IV Protonix.  Hospitalist was contacted for observation admission, the patient is feeling much better now and this morning is completely asymptomatic.  Review of Systems: Please see HPI for pertinent positives and negatives. A complete 10 system review of systems are otherwise negative.  Past Medical History:  Diagnosis Date   Adrenal hyperplasia (HCC)    Anemia, unspecified    Arthropathy, unspecified, site unspecified    Benign hypertension, endocrine    Cervical stenosis of spine    Changes in vascular appearance of retina     CHF (congestive heart failure) (HCC)    CKD (chronic kidney disease) stage 3, GFR 30-59 ml/min (HCC)    followed by [facility] (Dr. Patton Salles)   Diverticulosis of colon (without mention of hemorrhage)    Esophageal reflux    Gout    HLD (hyperlipidemia)    Hyperaldosteronism, unspecified (HCC)    Hypertrophy of prostate without urinary obstruction and other lower urinary tract symptoms (LUTS)    Mixed hearing loss, unspecified    Obesity    Ruptured lumbar disc    L5-S1   Type II or unspecified type diabetes mellitus without mention of complication, not stated as uncontrolled    Unspecified vitamin D deficiency    Varicocele    Left   Past Surgical History:  Procedure Laterality Date   Bone Scan  11/16/07   Normal   CARDIAC CATHETERIZATION  12/14/00   Dr. Leota Jacobsen   CARDIAC CATHETERIZATION  1989   Dr. Sena Slate   CARDIOVASCULAR STRESS TEST  10/21/05   WNL, but had EKG changes   Cervical MRI  04/16/03   Stenosis of C4/5,C5/6 with Narrowing   EMG/NCS  05/16/03   RVL Normal, Borderline RCTS   ESOPHAGOGASTRODUODENOSCOPY  11/98   Gastritis; Duod ulcer   ESOPHAGOGASTRODUODENOSCOPY  05/06/01   Dr. Arty Baumgartner Mucosa Bx-Negative   FLEXIBLE SIGMOIDOSCOPY  07/18/98   Dr. Woodroe Mode   Lumbosacral MRI  11/16/07   L5-S1 Disc Rupture   NECK SURGERY  10/26/03   Dr. Otelia Sergeant   Pelvic MRI  11/16/07   Normal   Renal  Arterial US  08/10/03   Negative   ROTATOR CUFF REPAIR Right 01/17/04   Testicular US  09/07/00   Left-sided varicocele (small)   TYMPANIC MEMBRANE REPAIR Right 1972   Prosthesis   US ECHOCARDIOGRAPHY  12/07/00   EF 70 %    Social History:  reports that he has never smoked. He has never used smokeless tobacco. He reports that he does not drink alcohol and does not use drugs.   Allergies  Allergen Reactions   Spironolactone Other (See Comments)    Gynecomastia   Hydromorphone Hcl     REACTION: UNSPECIFIED   Oxycodone Hcl     REACTION: itch    Family  History  Problem Relation Age of Onset   Cirrhosis Father        ETOH   Hypertension Mother        Kidney transplant   Congestive Heart Failure Mother    Hypertension Brother    Drug abuse Brother    Stroke Maternal Grandmother      Prior to Admission medications   Medication Sig Start Date End Date Taking? Authorizing Provider  acetaminophen (TYLENOL) 500 MG tablet Take 500 mg by mouth 2 (two) times daily.   Yes [provider]  amLODipine (NORVASC) 10 MG tablet Take 10 mg by mouth daily.   Yes [provider]  amoxicillin-clavulanate (AUGMENTIN) 500-125 MG tablet Take 1 tablet by mouth 2 (two) times daily for 7 doses. 12/09/22 12/13/22 Yes Briant Cedar, MD  Cholecalciferol (VITAMIN D3 PO) Take 2,000 Units by mouth daily.   Yes [provider]  dutasteride (AVODART) 0.5 MG capsule Take 0.5 mg by mouth daily.   Yes [provider]  ferrous sulfate 325 (65 FE) MG tablet Take 325 mg by mouth every Monday, Wednesday, and Friday.   Yes [provider]  finasteride (PROSCAR) 5 MG tablet Take 5 mg by mouth daily.   Yes [provider]  furosemide (LASIX) 40 MG tablet Take 40 mg by mouth daily.   Yes [provider]  lisinopril (ZESTRIL) 40 MG tablet Take 40 mg by mouth daily. 12/03/22  Yes [provider]  Multiple Vitamin (MULTIVITAMIN PO) Take 1 tablet by mouth in the morning.   Yes [provider]  pantoprazole (PROTONIX) 40 MG tablet Take 1 tablet (40 mg total) by mouth daily. 10/02/22  Yes Rana Snare, DO  potassium chloride SA (KLOR-CON M) 20 MEQ tablet Take 20 mEq by mouth 2 (two) times daily.   Yes [provider]  prazosin (MINIPRESS) 2 MG capsule Take 4 mg by mouth at bedtime.   Yes [provider]  rosuvastatin (CRESTOR) 10 MG tablet Take 1 tablet (10 mg total) by mouth daily. 10/02/22  Yes Rana Snare, DO  traMADol (ULTRAM) 50 MG tablet Take 50 mg by mouth every 6 (six) hours  as needed for moderate pain. 12/06/18  Yes [provider]  allopurinol (ZYLOPRIM) 100 MG tablet Take 1 tablet (100 mg total) by mouth 3 (three) times a week. Take after HD 12/09/22 01/08/23  Briant Cedar, MD  apixaban (ELIQUIS) 5 MG TABS tablet Take 1 tablet (5 mg total) by mouth 2 (two) times daily. 12/09/22 01/08/23  Briant Cedar, MD  cloNIDine (CATAPRES - DOSED IN MG/24 HR) 0.3 mg/24hr patch Place 0.3 mg onto the skin once a week. 11/10/22   [provider]  furosemide (LASIX) 40 MG tablet Take 1 tablet (40 mg total) by mouth 2 (two) times daily.  Patient taking differently: Take 40 mg by mouth daily. 10/01/22 12/08/22  Rana Snare, DO  metoprolol tartrate (LOPRESSOR) 50 MG tablet Take 1 tablet (50 mg total) by mouth 2 (two) times daily. 12/09/22 01/08/23  Briant Cedar, MD    Physical Exam: BP (!) 168/69   Pulse 83   Temp 98.9 F (37.2 C) (Oral)   Resp 16   Ht 5\' 9"  (1.753 m)   Wt 82.6 kg   SpO2 98%   BMI 26.88 kg/m   General:  Alert, oriented, calm, in no acute distress, pleasant and cooperative looks very comfortable Eyes: EOMI, clear conjuctivae, white sclerea Neck: supple, no masses, trachea mildline  Cardiovascular: RRR, no murmurs or rubs, no peripheral edema  Respiratory: clear to auscultation bilaterally, no wheezes, no crackles  Abdomen: soft, nontender, nondistended, normal bowel tones heard  Skin: dry, no rashes  Musculoskeletal: no joint effusions, normal range of motion  Psychiatric: appropriate affect, normal speech  Neurologic: extraocular muscles intact, clear speech, moving all extremities with intact sensorium          Labs on Admission:  Basic Metabolic Panel: Recent Labs  Lab 12/07/22 2124 12/08/22 0633 12/09/22 0556 12/10/22 0050  NA 140  --  139 138  K 3.3*  --  3.5 2.7*  CL 104  --  105 103  CO2 20*  --  25 22  GLUCOSE 156*  --  110* 107*  BUN 41*  --  46* 29*  CREATININE 4.91*  --  5.32* 3.73*  CALCIUM 9.2   --  8.2* 7.9*  MG  --  1.5* 2.9*  --    Liver Function Tests: Recent Labs  Lab 12/07/22 2124 12/09/22 0556 12/10/22 0050  AST 27 21 29   ALT 15 13 17   ALKPHOS 106 78 92  BILITOT 0.9 0.4 0.8  PROT 6.9 5.4* 6.2*  ALBUMIN 3.8 2.8* 3.3*   Recent Labs  Lab 12/07/22 2124 12/10/22 0050  LIPASE 55* 48   No results for input(s): "AMMONIA" in the last 168 hours. CBC: Recent Labs  Lab 12/07/22 2124 12/10/22 0050  WBC 7.0 7.2  NEUTROABS 6.2 6.5  HGB 12.9* 12.8*  HCT 38.4* 37.9*  MCV 79.7* 79.1*  PLT 203 180   Cardiac Enzymes: No results for input(s): "CKTOTAL", "CKMB", "CKMBINDEX", "TROPONINI" in the last 168 hours.  BNP (last 3 results) Recent Labs    09/28/22 1545 12/07/22 2124  BNP 770.3* 516.4*    ProBNP (last 3 results) No results for input(s): "PROBNP" in the last 8760 hours.  CBG: Recent Labs  Lab 12/08/22 0758 12/08/22 1150 12/08/22 1820 12/08/22 2149 12/09/22 0719  GLUCAP 124* 110* 106* 118* 104*    Radiological Exams on Admission: CT ABDOMEN PELVIS WO CONTRAST  Result Date: 12/10/2022 CLINICAL DATA:  Abdominal pain with nausea and vomiting. Recent history of diverticulitis. EXAM: CT ABDOMEN AND PELVIS WITHOUT CONTRAST TECHNIQUE: Multidetector CT imaging of the abdomen and pelvis was performed following the standard protocol without IV contrast. RADIATION DOSE REDUCTION: This exam was performed according to the departmental dose-optimization program which includes automated exposure control, adjustment of the mA and/or kV according to patient size and/or use of iterative reconstruction technique. COMPARISON:  12/07/2022. FINDINGS: Lower chest: The heart is normal in size and there is a small pericardial effusion. Mild atelectasis is present at the lung bases. Hepatobiliary: A subcentimeter hypodensity is present in the left lobe of the liver, statistically most likely representing cysts or hemangioma. Stones are present within the gallbladder.  No biliary  ductal dilatation. Pancreas: Unremarkable. No pancreatic ductal dilatation or surrounding inflammatory changes. Spleen: Normal in size without focal abnormality. Adrenals/Urinary Tract: The adrenal glands are within normal limits. There are bilateral renal cysts and calculi. No hydronephrosis bilaterally. The bladder is unremarkable. Stomach/Bowel: Stomach is within normal limits. Appendix appears normal. No evidence of bowel wall thickening, distention, or inflammatory changes. No free air or pneumatosis. Multiple diverticula are present along the colon without evidence of diverticulitis. There is diastasis of the rectus abdominus with a broad-based umbilical hernia containing nonobstructed bowel. Vascular/Lymphatic: Aortic atherosclerosis. No enlarged abdominal or pelvic lymph nodes. Reproductive: Prostate is unremarkable. Other: A small amount of free fluid is present in the pelvis. Musculoskeletal: Degenerative changes are present in the thoracolumbar spine. No acute osseous abnormality. IMPRESSION: 1. No acute intra-abdominal process. 2. Diverticulosis without diverticulitis. 3. Bilateral nephrolithiasis. 4. Small amount of free fluid in the pelvis. 5. Aortic atherosclerosis. Electronically Signed   By: Thornell Sartorius M.D.   On: 12/10/2022 03:18   DG Chest Port 1 View  Result Date: 12/10/2022 CLINICAL DATA:  Vomiting EXAM: PORTABLE CHEST 1 VIEW COMPARISON:  None Available. FINDINGS: The heart size and mediastinal contours are within normal limits. Both lungs are clear. The visualized skeletal structures are unremarkable. IMPRESSION: No active disease. Electronically Signed   By: Helyn Numbers M.D.   On: 12/10/2022 00:40   ECHOCARDIOGRAM COMPLETE  Result Date: 12/08/2022    ECHOCARDIOGRAM REPORT   Patient Name:   Karl Robinson Date of Exam: 12/08/2022 Medical Rec #:  [id number]      Height:       69.0 in Accession #:    [id number]     Weight:       192.2 lb Date of Birth:  08-Sep-1950      BSA:           2.032 m Patient Age:    71 years       BP:           171/95 mmHg Patient Gender: M              HR:           74 bpm. Exam Location:  Inpatient Procedure: 2D Echo, Cardiac Doppler and Color Doppler Indications:    Atrial Fibrillation I48.91  History:        Patient has prior history of Echocardiogram examinations, most                 recent 09/29/2022. CHF, Arrythmias:Atrial Fibrillation,                 Signs/Symptoms:Dyspnea; Risk Factors:Diabetes.  Sonographer:    Darlys Gales Referring Phys: [phone] DEBBY CROSLEY IMPRESSIONS  1. Left ventricular ejection fraction, by estimation, is 60 to 65%. The left ventricle has normal function. The left ventricle has no regional wall motion abnormalities. There is mild concentric left ventricular hypertrophy. Left ventricular diastolic parameters are consistent with Grade II diastolic dysfunction (pseudonormalization). Elevated left atrial pressure.  2. Right ventricular systolic function is normal. The right ventricular size is normal. There is mildly elevated pulmonary artery systolic pressure.  3. Left atrial size was mild to moderately dilated.  4. The mitral valve is normal in structure. Moderate mitral valve regurgitation. No evidence of mitral stenosis.  5. Tricuspid valve regurgitation is mild to moderate.  6. The aortic valve is tricuspid. Aortic valve regurgitation is not visualized. No aortic stenosis is present.  7. The inferior vena cava  is normal in size with greater than 50% respiratory variability, suggesting right atrial pressure of 3 mmHg. Comparison(s): No significant change from prior study. Prior images reviewed side by side. Pericardial effusion is even smaller, probably physiological. FINDINGS  Left Ventricle: Left ventricular ejection fraction, by estimation, is 60 to 65%. The left ventricle has normal function. The left ventricle has no regional wall motion abnormalities. The left ventricular internal cavity size was normal in size. There is  mild  concentric left ventricular hypertrophy. Left ventricular diastolic parameters are consistent with Grade II diastolic dysfunction (pseudonormalization). Elevated left atrial pressure. Right Ventricle: The right ventricular size is normal. No increase in right ventricular wall thickness. Right ventricular systolic function is normal. There is mildly elevated pulmonary artery systolic pressure. The tricuspid regurgitant velocity is 3.09  m/s, and with an assumed right atrial pressure of 3 mmHg, the estimated right ventricular systolic pressure is 41.2 mmHg. Left Atrium: Left atrial size was mild to moderately dilated. Right Atrium: Right atrial size was normal in size. Pericardium: Trivial pericardial effusion is present. Mitral Valve: The mitral valve is normal in structure. Moderate mitral valve regurgitation, with posteriorly-directed jet. No evidence of mitral valve stenosis. Tricuspid Valve: The tricuspid valve is normal in structure. Tricuspid valve regurgitation is mild to moderate. No evidence of tricuspid stenosis. Aortic Valve: The aortic valve is tricuspid. Aortic valve regurgitation is not visualized. No aortic stenosis is present. Aortic valve mean gradient measures 6.0 mmHg. Aortic valve peak gradient measures 9.5 mmHg. Aortic valve area, by VTI measures 1.91 cm. Pulmonic Valve: The pulmonic valve was normal in structure. Pulmonic valve regurgitation is not visualized. No evidence of pulmonic stenosis. Aorta: The aortic root is normal in size and structure. Venous: The inferior vena cava is normal in size with greater than 50% respiratory variability, suggesting right atrial pressure of 3 mmHg. IAS/Shunts: No atrial level shunt detected by color flow Doppler.  LEFT VENTRICLE PLAX 2D LVIDd:         5.30 cm   Diastology LVIDs:         3.70 cm   LV e' medial:    4.57 cm/s LV PW:         1.50 cm   LV E/e' medial:  21.6 LV IVS:        1.20 cm   LV e' lateral:   5.44 cm/s LVOT diam:     1.80 cm   LV E/e'  lateral: 18.2 LV SV:         66 LV SV Index:   33 LVOT Area:     2.54 cm  RIGHT VENTRICLE RV S prime:     16.30 cm/s TAPSE (M-mode): 2.4 cm LEFT ATRIUM             Index        RIGHT ATRIUM           Index LA Vol (A2C):   67.9 ml 33.42 ml/m  RA Area:     17.90 cm LA Vol (A4C):   82.5 ml 40.61 ml/m  RA Volume:   45.60 ml  22.45 ml/m LA Biplane Vol: 77.0 ml 37.90 ml/m  AORTIC VALVE AV Area (Vmax):    1.88 cm AV Area (Vmean):   1.72 cm AV Area (VTI):     1.91 cm AV Vmax:           154.00 cm/s AV Vmean:          112.000 cm/s AV VTI:  0.346 m AV Peak Grad:      9.5 mmHg AV Mean Grad:      6.0 mmHg LVOT Vmax:         114.00 cm/s LVOT Vmean:        75.800 cm/s LVOT VTI:          0.260 m LVOT/AV VTI ratio: 0.75 MITRAL VALVE               TRICUSPID VALVE MV Area (PHT): 4.21 cm    TR Peak grad:   38.2 mmHg MV Decel Time: 180 msec    TR Vmax:        309.00 cm/s MV E velocity: 98.85 cm/s MV A velocity: 69.40 cm/s  SHUNTS MV E/A ratio:  1.42        Systemic VTI:  0.26 m                            Systemic Diam: 1.80 cm Mihai Croitoru MD Electronically signed by Thurmon Fair MD Signature Date/Time: 12/08/2022/4:32:42 PM    Final     Assessment/Plan This is a pleasant 72 year old gentleman with a history of hypertension, CKD stage IV recently started dialysis, newly diagnosed atrial fibrillation on beta-blocker and Eliquis just discharged from [facility] 5/22 after treatment of diverticulitis who returned to the ER later that evening with complaints of abdominal pain and vomiting.  ER workup including labs and imaging are unremarkable, the patient has feeling better this morning.  Unclear etiology of recurrent nausea vomiting, ?GERD. -Observation admission -Resume oral medications as prescribed at discharge, and listed below -Okay to resume diet as tolerated -Continue IV PPI for now  Atrial fibrillation-rate controlled -P.o. metoprolol -Eliquis  Hypertension-continue metoprolol, with IV  metoprolol as needed.  If blood pressure remains elevated, could consider resuming Norvasc.  Diverticulitis-imaging shows no evidence of acute infection/inflammation, no other acute findings in the abdomen or pelvis. -Continue Augmentin  Hypokalemia-give 40 mEq p.o. potassium now, recheck with morning labs  Chronic kidney disease, stage 4 (severe) (HCC)-due for routine dialysis in the morning, discussed with Dr. Juel Burrow.  No indication for urgent dialysis at the moment.  DVT prophylaxis: Eliquis    Code Status: Full Code  Consults called: Dr. Juel Burrow for HD at [facility] in AM.  Admission status: Observation   Time spent: 68 minutes  Robbyn Hodkinson Sharlette Dense MD [facility] Pager [phone]  If 7PM-7AM, please contact night-coverage [email or web address] Password [facility]  12/10/2022, 8:45 AM

## 2022-12-11 DIAGNOSIS — R1013 Epigastric pain: Secondary | ICD-10-CM | POA: Diagnosis not present

## 2022-12-11 LAB — COMPREHENSIVE METABOLIC PANEL
ALT: 18 U/L (ref 0–44)
AST: 32 U/L (ref 15–41)
Albumin: 2.5 g/dL — ABNORMAL LOW (ref 3.5–5.0)
Alkaline Phosphatase: 74 U/L (ref 38–126)
Anion gap: 12 (ref 5–15)
BUN: 39 mg/dL — ABNORMAL HIGH (ref 8–23)
CO2: 22 mmol/L (ref 22–32)
Calcium: 8 mg/dL — ABNORMAL LOW (ref 8.9–10.3)
Chloride: 104 mmol/L (ref 98–111)
Creatinine, Ser: 5.25 mg/dL — ABNORMAL HIGH (ref 0.61–1.24)
GFR, Estimated: 11 mL/min — ABNORMAL LOW (ref >60)
Glucose, Bld: 115 mg/dL — ABNORMAL HIGH (ref 70–99)
Potassium: 3.7 mmol/L (ref 3.5–5.1)
Sodium: 138 mmol/L (ref 135–145)
Total Bilirubin: 0.8 mg/dL (ref 0.3–1.2)
Total Protein: 4.7 g/dL — ABNORMAL LOW (ref 6.5–8.1)

## 2022-12-11 LAB — CBC
HCT: 32.1 % — ABNORMAL LOW (ref 39.0–52.0)
Hemoglobin: 10.9 g/dL — ABNORMAL LOW (ref 13.0–17.0)
MCH: 27 pg (ref 26.0–34.0)
MCHC: 34 g/dL (ref 30.0–36.0)
MCV: 79.7 fL — ABNORMAL LOW (ref 80.0–100.0)
Platelets: 152 10*3/uL (ref 150–400)
RBC: 4.03 MIL/uL — ABNORMAL LOW (ref 4.22–5.81)
RDW: 13.4 % (ref 11.5–15.5)
WBC: 5.2 10*3/uL (ref 4.0–10.5)
nRBC: 0 % (ref 0.0–0.2)

## 2022-12-11 LAB — CULTURE, BLOOD (ROUTINE X 2)
Culture: NO GROWTH
Special Requests: ADEQUATE

## 2022-12-11 LAB — HEPATITIS B SURFACE ANTIBODY, QUANTITATIVE: Hep B S AB Quant (Post): <3.5 m[IU]/mL — ABNORMAL LOW (ref >9.9)

## 2022-12-11 LAB — T4, FREE: Free T4: 0.9 ng/dL (ref 0.61–1.12)

## 2022-12-11 MED ORDER — ALTEPLASE 2 MG IJ SOLR: 2.0000 mg | Freq: Once | INTRAMUSCULAR | Status: DC | PRN

## 2022-12-11 MED ORDER — HEPARIN SODIUM (PORCINE) 1000 UNIT/ML DIALYSIS: 1000.0000 [IU] | INTRAMUSCULAR | Status: DC | PRN

## 2022-12-11 MED ORDER — LIDOCAINE HCL (PF) 1 % IJ SOLN: 5.0000 mL | INTRAMUSCULAR | Status: DC | PRN

## 2022-12-11 MED ORDER — LIDOCAINE-PRILOCAINE 2.5-2.5 % EX CREA: 1.0000 | TOPICAL_CREAM | CUTANEOUS | Status: DC | PRN

## 2022-12-11 MED ORDER — PENTAFLUOROPROP-TETRAFLUOROETH EX AERO: 1.0000 | INHALATION_SPRAY | CUTANEOUS | Status: DC | PRN

## 2022-12-11 MED ORDER — ANTICOAGULANT SODIUM CITRATE 4% (200MG/5ML) IV SOLN: 5.0000 mL | Status: DC | PRN

## 2022-12-11 NOTE — Progress Notes (Addendum)
Pt receives out-pt HD at [facility] HD unit on MWF 12:15 chair time. Pt had first treatment on Wednesday. Clinic can treat pt today if pt stable for d/c and able to arrive by 1:00. Clinic can also treat pt tomorrow if pt stable for d/c today and appropriate for out-pt HD tomorrow. This info was provided to attending, nephrologist, and pt's RN. Will assist as needed.   Olivia Canter Renal Navigator [phone]  Addendum at 10:18 am: Pt has been d/c. Plan is for pt to go to [facility] HD clinic today by 1:00 for treatment. Met with pt and visitor at bedside. Pt agreeable to d/c and to go to [place] clinic for HD this afternoon by 1:00. Pt's RN made aware of plans. Message left for [place], [place] CSW, regarding pt's d/c today and to arrive by 1:00 for treatment.   Addendum at 10:39 am: [facility] and spoke to RN. Staff advised that pt will d/c this morning and be at clinic by 1:00 to complete treatment today. Renal consult note faxed to clinic for continuation of care. Will fax d/c summary once completed.  Addendum at 10:41 am: Received a return call from [place] [facility] CSW. Victorino Dike advised pt will d/c today and be at clinic by 1:00 for treatment today. Clinic/staff agreeable to plan.

## 2022-12-11 NOTE — TOC Transition Note (Signed)
Transition of Care [facility]) - CM/SW Discharge Note   Patient Details  Name: Karl Robinson MRN: [id number] Date of Birth: 06-20-1951  Transition of Care [facility] CM/SW Contact:  Tom-Johnson, Hershal Coria, RN Phone Number: 12/11/2022, 11:11 AM   Clinical Narrative:     Patient is scheduled for discharge today.  Hospital f/u and discharge instructions on AVS. No TOC needs or recommendations noted. Wife, saundra to transport at discharge.  No further TOC needs noted.        Final next level of care: Home/Self Care Barriers to Discharge: Barriers Resolved   Patient Goals and CMS Choice CMS Medicare.gov Compare Post Acute Care list provided to:: Patient Choice offered to / list presented to : NA  Discharge Placement                  Patient to be transferred to facility by: Wife Name of family member notified: [place]    Discharge Plan and Services Additional resources added to the After Visit Summary for                  DME Arranged: N/A DME Agency: NA       HH Arranged: NA HH Agency: NA        Social Determinants of Health (SDOH) Interventions SDOH Screenings   Food Insecurity: No Food Insecurity (09/29/2022)  Housing: Low Risk  (09/29/2022)  Transportation Needs: No Transportation Needs (09/29/2022)  Utilities: Not At Risk (09/29/2022)  Tobacco Use: Low Risk  (12/08/2022)     Readmission Risk Interventions    12/09/2022    9:25 AM  Readmission Risk Prevention Plan  Transportation Screening Complete  Home Care Screening Complete  Medication Review (RN CM) Complete

## 2022-12-11 NOTE — Discharge Summary (Signed)
Physician Discharge Summary  Karl Robinson WUJ:[id number] DOB: [birth year] DOA: 12/09/2022  PCP: [facility]  Admit date: 12/09/2022 Discharge date: 12/11/2022  Admitted From: Home Discharge disposition: Home  Recommendations at discharge:  Encouraged compliance with outpatient dialysis Encouraged to complete the course of antibiotics as planned in previous discharge.   Brief narrative: Karl Robinson is a 72 y.o. male with PMH significant for ESRD-HD-MWF, DM2, HTN, HLD, CHF, chronic anemia, colonic diverticulosis, GERD, gout, BPH, adrenal hyperplasia, hyperaldosteronism.   Patient recently progressed from CKD 5 to ESRD and was scheduled for his first outpatient dialysis session at [facility] on 5/22.   5/20, patient presented to [facility], ED with complaint of abdominal pain and several episodes of vomiting.  CT abdomen showed mild uncomplicated sigmoid diverticulitis.  He was admitted and started on IV antibiotics.  He was also noted to be in new onset A-fib and was started on oral metoprolol and Eliquis.  At the request of the patient, he was discharged on 5/22 on oral Augmentin.   Post discharge, he directly went to his first outpatient dialysis session.  He tolerated dialysis well but soon after getting home he had sudden onset of severe epigastric cramping abdominal pain with vomiting and subjective fever.  This felt very similar to when he initially had acute diverticulitis few days ago.  He tried taking his prescribed Augmentin thinking it might make him feel better, but he vomited that up as well.  He therefore returned back to ED the same day 5/22 for further evaluation.  In the ED, patient was hypertensive to 220/100, tachycardic Labs showed potassium level low at 2.7 CT abdomen pelvis was repeated which did not show any acute intra-abdominal process, showed diverticulosis without diverticulitis, bilateral nephrolithiasis without hydronephrosis. He was given IV Lasix.  Also  started on symptomatic management with IV antiemetics, IV analgesics, IV Protonix. Oral Augmentin was continued Admitted to [facility] Seen by nephrology. Kept in observation overnight.  Subjective: Patient was seen and examined this morning.  Not in distress.  Ready to go home Chart reviewed. Overnight, heart rate in 60s and 70s, blood pressure 140s this morning Labs with potassium improved to 3.7, WBC count normal  Hospital course: Recent acute diverticulitis CT abdomen from 5/20 showed mild sigmoid diverticulitis.  Started on a course of antibiotics.  Rehospitalized because of inability to take oral intake.  Repeat CT abdomen 5/22 did not show any evidence of diverticulitis.  Clinically improving and able to tolerate oral intake. Plan to discharge today to continue oral Augmentin as planned before.  Recently diagnosed A-fib  Continue metoprolol and Eliquis as before  Essential hypertension Continue home regimen as instructed in recent discharge.  ESRD-HD-MWF Patient can be discharged this morning to continue outpatient dialysis at [facility]  HLD Statin  Wounds:  -    Discharge Exam:   Vitals:   12/10/22 2241 12/11/22 0345 12/11/22 0518 12/11/22 0946  BP: (!) 150/66 (!) 180/70 (!) 160/72 (!) 148/61  Pulse: 61 (!) 55 (!) 55 62  Resp: (!) 24 18 19 16   Temp: 98.5 F (36.9 C) 98.9 F (37.2 C) 98.9 F (37.2 C) 99.2 F (37.3 C)  TempSrc: Oral Oral Oral Oral  SpO2: 100% 100% 100% 100%  Weight:   79 kg   Height:        Body mass index is 25.72 kg/m.  General exam: Pleasant, not in distress Skin: No rashes, lesions or ulcers. HEENT: Atraumatic, normocephalic, no obvious bleeding Lungs: Clear to  auscultation bilaterally CVS: Regular rate and rhythm no murmur GI/Abd soft, nontender, nondistended, bowel sound present CNS: Alert, oriented x 3 Psychiatry: Mood appropriate Extremities: No pedal edema, no calf tenderness  Follow ups:    Follow-up Information     [facility] Follow up.   Specialty: General Practice Contact information: [address] [place] [address] [phone]                 Discharge Instructions:   Discharge Instructions     Call MD for:  difficulty breathing, headache or visual disturbances   Complete by: As directed    Call MD for:  extreme fatigue   Complete by: As directed    Call MD for:  hives   Complete by: As directed    Call MD for:  persistant dizziness or light-headedness   Complete by: As directed    Call MD for:  persistant nausea and vomiting   Complete by: As directed    Call MD for:  severe uncontrolled pain   Complete by: As directed    Call MD for:  temperature >100.4   Complete by: As directed    Diet general   Complete by: As directed    Discharge instructions   Complete by: As directed    General discharge instructions: Follow with Primary MD [facility] in 7 days  Please request your PCP  to go over your hospital tests, procedures, radiology results at the follow up. Please get your medicines reviewed and adjusted.  Your PCP may decide to repeat certain labs or tests as needed. Do not drive, operate heavy machinery, perform activities at heights, swimming or participation in water activities or provide baby sitting services if your were admitted for syncope or siezures until you have seen by Primary MD or a Neurologist and advised to do so again. [place] Controlled Substance Reporting System database was reviewed. Do not drive, operate heavy machinery, perform activities at heights, swim, participate in water activities or provide baby-sitting services while on medications for pain, sleep and mood until your outpatient physician has reevaluated you and advised to do so again.  You are strongly recommended to comply with the dose, frequency and duration of prescribed medications. Activity: As tolerated with Full fall precautions use walker/cane & assistance as  needed Avoid using any recreational substances like cigarette, tobacco, alcohol, or non-prescribed drug. If you experience worsening of your admission symptoms, develop shortness of breath, life threatening emergency, suicidal or homicidal thoughts you must seek medical attention immediately by calling 911 or calling your MD immediately  if symptoms less severe. You must read complete instructions/literature along with all the possible adverse reactions/side effects for all the medicines you take and that have been prescribed to you. Take any new medicine only after you have completely understood and accepted all the possible adverse reactions/side effects.  Wear Seat belts while driving. You were cared for by a hospitalist during your hospital stay. If you have any questions about your discharge medications or the care you received while you were in the hospital after you are discharged, you can call the unit and ask to speak with the hospitalist or the covering physician. Once you are discharged, your primary care physician will handle any further medical issues. Please note that NO REFILLS for any discharge medications will be authorized once you are discharged, as it is imperative that you return to your primary care physician (or establish a relationship with a primary  care physician if you do not have one).   Discharge wound care:   Complete by: As directed    Increase activity slowly   Complete by: As directed        Discharge Medications:   Allergies as of 12/11/2022       Reactions   Spironolactone Other (See Comments)   Gynecomastia   Hydromorphone Hcl    REACTION: UNSPECIFIED   Oxycodone Hcl    REACTION: itch        Medication List     TAKE these medications    acetaminophen 500 MG tablet Commonly known as: TYLENOL Take 500 mg by mouth 2 (two) times daily.   allopurinol 100 MG tablet Commonly known as: ZYLOPRIM Take 1 tablet (100 mg total) by mouth 3 (three) times a  week. Take after HD   amLODipine 10 MG tablet Commonly known as: NORVASC Take 10 mg by mouth daily.   amoxicillin-clavulanate 500-125 MG tablet Commonly known as: Augmentin Take 1 tablet by mouth 2 (two) times daily for 7 doses.   apixaban 5 MG Tabs tablet Commonly known as: ELIQUIS Take 1 tablet (5 mg total) by mouth 2 (two) times daily.   cloNIDine 0.3 mg/24hr patch Commonly known as: CATAPRES - Dosed in mg/24 hr Place 0.3 mg onto the skin once a week.   dutasteride 0.5 MG capsule Commonly known as: AVODART Take 0.5 mg by mouth daily.   ferrous sulfate 325 (65 FE) MG tablet Take 325 mg by mouth every Monday, Wednesday, and Friday.   finasteride 5 MG tablet Commonly known as: PROSCAR Take 5 mg by mouth daily.   furosemide 40 MG tablet Commonly known as: LASIX Take 40 mg by mouth daily. What changed: Another medication with the same name was removed. Continue taking this medication, and follow the directions you see here.   lisinopril 40 MG tablet Commonly known as: ZESTRIL Take 40 mg by mouth daily.   metoprolol tartrate 50 MG tablet Commonly known as: LOPRESSOR Take 1 tablet (50 mg total) by mouth 2 (two) times daily.   MULTIVITAMIN PO Take 1 tablet by mouth in the morning.   pantoprazole 40 MG tablet Commonly known as: PROTONIX Take 1 tablet (40 mg total) by mouth daily.   potassium chloride SA 20 MEQ tablet Commonly known as: KLOR-CON M Take 20 mEq by mouth 2 (two) times daily.   prazosin 2 MG capsule Commonly known as: MINIPRESS Take 4 mg by mouth at bedtime.   rosuvastatin 10 MG tablet Commonly known as: CRESTOR Take 1 tablet (10 mg total) by mouth daily.   traMADol 50 MG tablet Commonly known as: ULTRAM Take 50 mg by mouth every 6 (six) hours as needed for moderate pain.   VITAMIN D3 PO Take 2,000 Units by mouth daily.               Discharge Care Instructions  (From admission, onward)           Start     Ordered   12/11/22  0000  Discharge wound care:        12/11/22 1000             The results of significant diagnostics from this hospitalization (including imaging, microbiology, ancillary and laboratory) are listed below for reference.    Procedures and Diagnostic Studies:   CT ABDOMEN PELVIS WO CONTRAST  Result Date: 12/10/2022 CLINICAL DATA:  Abdominal pain with nausea and vomiting. Recent history of diverticulitis. EXAM: CT ABDOMEN AND  PELVIS WITHOUT CONTRAST TECHNIQUE: Multidetector CT imaging of the abdomen and pelvis was performed following the standard protocol without IV contrast. RADIATION DOSE REDUCTION: This exam was performed according to the departmental dose-optimization program which includes automated exposure control, adjustment of the mA and/or kV according to patient size and/or use of iterative reconstruction technique. COMPARISON:  12/07/2022. FINDINGS: Lower chest: The heart is normal in size and there is a small pericardial effusion. Mild atelectasis is present at the lung bases. Hepatobiliary: A subcentimeter hypodensity is present in the left lobe of the liver, statistically most likely representing cysts or hemangioma. Stones are present within the gallbladder. No biliary ductal dilatation. Pancreas: Unremarkable. No pancreatic ductal dilatation or surrounding inflammatory changes. Spleen: Normal in size without focal abnormality. Adrenals/Urinary Tract: The adrenal glands are within normal limits. There are bilateral renal cysts and calculi. No hydronephrosis bilaterally. The bladder is unremarkable. Stomach/Bowel: Stomach is within normal limits. Appendix appears normal. No evidence of bowel wall thickening, distention, or inflammatory changes. No free air or pneumatosis. Multiple diverticula are present along the colon without evidence of diverticulitis. There is diastasis of the rectus abdominus with a broad-based umbilical hernia containing nonobstructed bowel. Vascular/Lymphatic: Aortic  atherosclerosis. No enlarged abdominal or pelvic lymph nodes. Reproductive: Prostate is unremarkable. Other: A small amount of free fluid is present in the pelvis. Musculoskeletal: Degenerative changes are present in the thoracolumbar spine. No acute osseous abnormality. IMPRESSION: 1. No acute intra-abdominal process. 2. Diverticulosis without diverticulitis. 3. Bilateral nephrolithiasis. 4. Small amount of free fluid in the pelvis. 5. Aortic atherosclerosis. Electronically Signed   By: Thornell Sartorius M.D.   On: 12/10/2022 03:18   DG Chest Port 1 View  Result Date: 12/10/2022 CLINICAL DATA:  Vomiting EXAM: PORTABLE CHEST 1 VIEW COMPARISON:  None Available. FINDINGS: The heart size and mediastinal contours are within normal limits. Both lungs are clear. The visualized skeletal structures are unremarkable. IMPRESSION: No active disease. Electronically Signed   By: Helyn Numbers M.D.   On: 12/10/2022 00:40     Labs:   Basic Metabolic Panel: Recent Labs  Lab 12/07/22 2124 12/08/22 0633 12/09/22 0556 12/10/22 0050 12/10/22 1142 12/11/22 0321  NA 140  --  139 138  --  138  K 3.3*  --  3.5 2.7* 3.3* 3.7  CL 104  --  105 103  --  104  CO2 20*  --  25 22  --  22  GLUCOSE 156*  --  110* 107*  --  115*  BUN 41*  --  46* 29*  --  39*  CREATININE 4.91*  --  5.32* 3.73*  --  5.25*  CALCIUM 9.2  --  8.2* 7.9*  --  8.0*  MG  --  1.5* 2.9*  --   --   --    GFR Estimated Creatinine Clearance: 12.7 mL/min (A) (by C-G formula based on SCr of 5.25 mg/dL (H)). Liver Function Tests: Recent Labs  Lab 12/07/22 2124 12/09/22 0556 12/10/22 0050 12/11/22 0321  AST 27 21 29  32  ALT 15 13 17 18   ALKPHOS 106 78 92 74  BILITOT 0.9 0.4 0.8 0.8  PROT 6.9 5.4* 6.2* 4.7*  ALBUMIN 3.8 2.8* 3.3* 2.5*   Recent Labs  Lab 12/07/22 2124 12/10/22 0050  LIPASE 55* 48   No results for input(s): "AMMONIA" in the last 168 hours. Coagulation profile Recent Labs  Lab 12/08/22 0633  INR 1.1    CBC: Recent  Labs  Lab 12/07/22 2124 12/10/22 0050  12/11/22 0321  WBC 7.0 7.2 5.2  NEUTROABS 6.2 6.5  --   HGB 12.9* 12.8* 10.9*  HCT 38.4* 37.9* 32.1*  MCV 79.7* 79.1* 79.7*  PLT 203 180 152   Cardiac Enzymes: No results for input(s): "CKTOTAL", "CKMB", "CKMBINDEX", "TROPONINI" in the last 168 hours. BNP: Invalid input(s): "POCBNP" CBG: Recent Labs  Lab 12/08/22 0758 12/08/22 1150 12/08/22 1820 12/08/22 2149 12/09/22 0719  GLUCAP 124* 110* 106* 118* 104*   D-Dimer No results for input(s): "DDIMER" in the last 72 hours. Hgb A1c No results for input(s): "HGBA1C" in the last 72 hours. Lipid Profile No results for input(s): "CHOL", "HDL", "LDLCALC", "TRIG", "CHOLHDL", "LDLDIRECT" in the last 72 hours. Thyroid function studies No results for input(s): "TSH", "T4TOTAL", "T3FREE", "THYROIDAB" in the last 72 hours.  Invalid input(s): "FREET3" Anemia work up No results for input(s): "VITAMINB12", "FOLATE", "FERRITIN", "TIBC", "IRON", "RETICCTPCT" in the last 72 hours. Microbiology Recent Results (from the past 240 hour(s))  Culture, blood (routine x 2)     Status: None (Preliminary result)   Collection Time: 12/10/22  7:40 AM   Specimen: BLOOD  Result Value Ref Range Status   Specimen Description   Final    BLOOD BLOOD RIGHT ARM Performed at [facility], [address] [place], [place] [address]    Special Requests   Final    BOTTLES DRAWN AEROBIC ONLY Blood Culture adequate volume Performed at [facility], [address], [place], [place] [address]    Culture   Final    NO GROWTH < 24 HOURS Performed at [facility], [address], [place], [place] [address]    Report Status PENDING  Incomplete  Culture, blood (routine x 2)     Status: None (Preliminary result)   Collection Time: 12/10/22  7:45 AM   Specimen: BLOOD  Result Value Ref Range Status   Specimen Description   Final    BLOOD BLOOD LEFT ARM Performed at [facility], [address] [place], [place] [address]    Special Requests   Final    BOTTLES DRAWN AEROBIC ONLY Blood Culture adequate volume Performed at [facility], [address], [place], [place] [address]    Culture   Final    NO GROWTH < 24 HOURS Performed at [facility], [address] [place], [place] [address]    Report Status PENDING  Incomplete  MRSA Next Gen by PCR, Nasal     Status: None   Collection Time: 12/10/22 11:28 AM   Specimen: Nasal Mucosa; Nasal Swab  Result Value Ref Range Status   MRSA by PCR Next Gen NOT DETECTED NOT DETECTED Final    Comment: (NOTE) The GeneXpert MRSA Assay (FDA approved for NASAL specimens only), is one component of a comprehensive MRSA colonization surveillance program. It is not intended to diagnose MRSA infection nor to guide or monitor treatment for MRSA infections. Test performance is not FDA approved in patients less than 57 years old. Performed at [facility], [address] [place], [place] [address]     Time coordinating discharge: 45 minutes  Signed: Taniqua Issa  [facility] 12/11/2022, 1:23 PM

## 2022-12-11 NOTE — Progress Notes (Signed)
DISCHARGE NOTE HOME Karl Robinson to be discharged Home per MD order. Discussed prescriptions and follow up appointments with the patient. Prescriptions given to patient; medication list explained in detail. Patient verbalized understanding.  Skin clean, dry and intact without evidence of skin break down, no evidence of skin tears noted. IV catheter discontinued intact. Site without signs and symptoms of complications. Dressing and pressure applied. Pt denies pain at the site currently. No complaints noted.  Patient free of lines, drains, and wounds.   An After Visit Summary (AVS) was printed and given to the patient. Patient escorted via wheelchair, and discharged home via private auto. Picked up by Karl Robinson (wife).  Karl Grizzle, RN

## 2022-12-13 LAB — CULTURE, BLOOD (ROUTINE X 2): Special Requests: ADEQUATE

## 2022-12-14 LAB — CULTURE, BLOOD (ROUTINE X 2): Culture: NO GROWTH

## 2022-12-15 LAB — CULTURE, BLOOD (ROUTINE X 2)

## 2022-12-15 NOTE — Progress Notes (Signed)
Late Note Entry  D/C summary faxed to clinic for continuation of care.   Olivia Canter Renal Navigator [phone]

## 2022-12-27 ENCOUNTER — Encounter (HOSPITAL_COMMUNITY): Payer: Self-pay | Admitting: Emergency Medicine

## 2022-12-27 ENCOUNTER — Emergency Department (HOSPITAL_COMMUNITY): Payer: No Typology Code available for payment source

## 2022-12-27 ENCOUNTER — Emergency Department (HOSPITAL_COMMUNITY)
Admission: EM | Admit: 2022-12-27 | Discharge: 2022-12-27 | Disposition: A | Discharge status: Home / Self Care | Payer: No Typology Code available for payment source | Attending: Emergency Medicine | Admitting: Emergency Medicine

## 2022-12-27 ENCOUNTER — Other Ambulatory Visit: Payer: Self-pay

## 2022-12-27 DIAGNOSIS — R251 Tremor, unspecified: Secondary | ICD-10-CM

## 2022-12-27 DIAGNOSIS — I10 Essential (primary) hypertension: Secondary | ICD-10-CM

## 2022-12-27 DIAGNOSIS — R030 Elevated blood-pressure reading, without diagnosis of hypertension: Secondary | ICD-10-CM

## 2022-12-27 DIAGNOSIS — N186 End stage renal disease: Secondary | ICD-10-CM | POA: Insufficient documentation

## 2022-12-27 DIAGNOSIS — Z992 Dependence on renal dialysis: Secondary | ICD-10-CM | POA: Insufficient documentation

## 2022-12-27 DIAGNOSIS — Z7901 Long term (current) use of anticoagulants: Secondary | ICD-10-CM | POA: Insufficient documentation

## 2022-12-27 DIAGNOSIS — I120 Hypertensive chronic kidney disease with stage 5 chronic kidney disease or end stage renal disease: Secondary | ICD-10-CM | POA: Diagnosis not present

## 2022-12-27 LAB — CBC WITH DIFFERENTIAL/PLATELET
Abs Immature Granulocytes: 0.02 10*3/uL (ref 0.00–0.07)
Basophils Absolute: 0 10*3/uL (ref 0.0–0.1)
Basophils Relative: 0 %
Eosinophils Absolute: 0.1 10*3/uL (ref 0.0–0.5)
Eosinophils Relative: 1 %
HCT: 35.2 % — ABNORMAL LOW (ref 39.0–52.0)
Hemoglobin: 11.6 g/dL — ABNORMAL LOW (ref 13.0–17.0)
Immature Granulocytes: 0 %
Lymphocytes Relative: 16 %
Lymphs Abs: 0.9 10*3/uL (ref 0.7–4.0)
MCH: 27.4 pg (ref 26.0–34.0)
MCHC: 33 g/dL (ref 30.0–36.0)
MCV: 83 fL (ref 80.0–100.0)
Monocytes Absolute: 0.4 10*3/uL (ref 0.1–1.0)
Monocytes Relative: 8 %
Neutro Abs: 3.9 10*3/uL (ref 1.7–7.7)
Neutrophils Relative %: 75 %
Platelets: 215 10*3/uL (ref 150–400)
RBC: 4.24 MIL/uL (ref 4.22–5.81)
RDW: 12.2 % (ref 11.5–15.5)
WBC: 5.4 10*3/uL (ref 4.0–10.5)
nRBC: 0 % (ref 0.0–0.2)

## 2022-12-27 LAB — COMPREHENSIVE METABOLIC PANEL
ALT: 19 U/L (ref 0–44)
AST: 27 U/L (ref 15–41)
Albumin: 3 g/dL — ABNORMAL LOW (ref 3.5–5.0)
Alkaline Phosphatase: 83 U/L (ref 38–126)
Anion gap: 10 (ref 5–15)
BUN: 18 mg/dL (ref 8–23)
CO2: 29 mmol/L (ref 22–32)
Calcium: 9.2 mg/dL (ref 8.9–10.3)
Chloride: 102 mmol/L (ref 98–111)
Creatinine, Ser: 4.95 mg/dL — ABNORMAL HIGH (ref 0.61–1.24)
GFR, Estimated: 12 mL/min — ABNORMAL LOW (ref >60)
Glucose, Bld: 120 mg/dL — ABNORMAL HIGH (ref 70–99)
Potassium: 3.5 mmol/L (ref 3.5–5.1)
Sodium: 141 mmol/L (ref 135–145)
Total Bilirubin: 0.6 mg/dL (ref 0.3–1.2)
Total Protein: 6 g/dL — ABNORMAL LOW (ref 6.5–8.1)

## 2022-12-27 LAB — URINALYSIS, ROUTINE W REFLEX MICROSCOPIC
Bilirubin Urine: NEGATIVE
Glucose, UA: NEGATIVE mg/dL
Hgb urine dipstick: NEGATIVE
Ketones, ur: NEGATIVE mg/dL
Leukocytes,Ua: NEGATIVE
Nitrite: NEGATIVE
Protein, ur: >=300 mg/dL — AB
Specific Gravity, Urine: 1.012 (ref 1.005–1.030)
pH: 8 (ref 5.0–8.0)

## 2022-12-27 LAB — MAGNESIUM: Magnesium: 1.6 mg/dL — ABNORMAL LOW (ref 1.7–2.4)

## 2022-12-27 LAB — AMMONIA: Ammonia: 20 umol/L (ref 9–35)

## 2022-12-27 MED ORDER — LACTATED RINGERS IV BOLUS
500.0000 mL | Freq: Once | INTRAVENOUS | Status: AC
  Administered 2022-12-27: 500 mL via INTRAVENOUS

## 2022-12-27 MED ORDER — PROPRANOLOL HCL 10 MG PO TABS: 10.0000 mg | ORAL_TABLET | Freq: Every day | ORAL | 0 refills | Status: AC | PRN

## 2022-12-27 MED ORDER — PROPRANOLOL HCL 10 MG PO TABS
10.0000 mg | ORAL_TABLET | Freq: Once | ORAL | Status: AC
  Administered 2022-12-27: 10 mg via ORAL
  Filled 2022-12-27: qty 1

## 2022-12-27 MED ORDER — MAGNESIUM SULFATE IN D5W 1-5 GM/100ML-% IV SOLN
1.0000 g | Freq: Once | INTRAVENOUS | Status: AC
  Administered 2022-12-27: 1 g via INTRAVENOUS
  Filled 2022-12-27: qty 100

## 2022-12-27 NOTE — ED Provider Triage Note (Signed)
Emergency Medicine Provider Triage Evaluation Note  Karl Robinson , a 72 y.o. male  was evaluated in triage.  Pt complains of tremors. He states that same began initially 2 weeks ago and worsened last night. Denies any history of same. No pain. States his whole body tremors intermittently and is out of his control. Last dialyzed on Friday. Recent admission for hypokalemia. Denies fevers or chills.  Review of Systems  Positive:  Negative:   Physical Exam  BP (!) 154/89   Pulse 78   Temp 98.7 F (37.1 C)   Resp 18   Ht 5\' 9"  (1.753 m)   Wt 79 kg   SpO2 98%   BMI 25.72 kg/m  Gen:   Awake, no distress   Resp:  Normal effort  MSK:   Moves extremities without difficulty  Other:  Intermittently tremulous  Medical Decision Making  Medically screening exam initiated at 11:36 AM.  Appropriate orders placed.  Karl Robinson was informed that the remainder of the evaluation will be completed by another provider, this initial triage assessment does not replace that evaluation, and the importance of remaining in the ED until their evaluation is complete.     Silva Bandy, PA-C 12/27/22 1142

## 2022-12-27 NOTE — Discharge Instructions (Addendum)
It was our pleasure to provide your ER care today - we hope that you feel better.  Drink adequate fluids/stay well hydrated. Get adequate nutrition.   We discussed your case with our neurologist and they indicate for you to follow up with Dr Tat - see attached info - call office Monday AM to arrange appointment. Have them also follow up on your lab tests done today.   Take propranolol as prescribed, as need, to see if helps with tremors.   Also follow up closely with your doctor regarding your blood pressure which is high today.  Return to ER if worse, new symptoms, fevers, new/severe pain, chest pain, trouble breathing, fainting, or other concern.

## 2022-12-27 NOTE — ED Notes (Signed)
Pt states he started having tremors 2 days ago which is new. Pt has HD MWF

## 2022-12-27 NOTE — ED Provider Notes (Addendum)
[facility] AT [facility] Provider Note   CSN: [id number] Arrival date & time: 12/27/22  1115     History  Chief Complaint  Patient presents with   Tremors    Karl Robinson is a 72 y.o. male.  Patient with hx esrd, hd m/w/f with normal hd 2 days ago, c/o tremors in past couple days. States feels shaky at rest. Denies hx same. Denies fever, chills, or sweats. Denies change in meds or new meds. Was recently treated for diverticulitis. Denies abd pain or nvd. Does make urine, no dysuria. No extremity pain or swelling. No midline neck or back pain. No new numbness or weakness. No change in speech or vision. No current or recent chest pain or discomfort of any sort. No palpitations. No sob or unusual doe.   The history is provided by the patient, medical records, a relative and the EMS personnel.       Home Medications Prior to Admission medications   Medication Sig Start Date End Date Taking? Authorizing Provider  propranolol (INDERAL) 10 MG tablet Take 1 tablet (10 mg total) by mouth daily as needed. May take once daily prn if tremors 12/27/22  Yes Cathren Laine, MD  acetaminophen (TYLENOL) 500 MG tablet Take 500 mg by mouth 2 (two) times daily.    [provider]  allopurinol (ZYLOPRIM) 100 MG tablet Take 1 tablet (100 mg total) by mouth 3 (three) times a week. Take after HD 12/09/22 01/08/23  Briant Cedar, MD  amLODipine (NORVASC) 10 MG tablet Take 10 mg by mouth daily.    [provider]  apixaban (ELIQUIS) 5 MG TABS tablet Take 1 tablet (5 mg total) by mouth 2 (two) times daily. 12/09/22 01/08/23  Briant Cedar, MD  Cholecalciferol (VITAMIN D3 PO) Take 2,000 Units by mouth daily.    [provider]  cloNIDine (CATAPRES - DOSED IN MG/24 HR) 0.3 mg/24hr patch Place 0.3 mg onto the skin once a week. 11/10/22   [provider]  dutasteride (AVODART) 0.5 MG capsule Take 0.5 mg by mouth daily.    [provider]  ferrous sulfate 325 (65 FE) MG tablet Take 325 mg by mouth every Monday, Wednesday, and Friday.    [provider]  finasteride (PROSCAR) 5 MG tablet Take 5 mg by mouth daily.    [provider]  furosemide (LASIX) 40 MG tablet Take 40 mg by mouth daily.    [provider]  lisinopril (ZESTRIL) 40 MG tablet Take 40 mg by mouth daily. 12/03/22   [provider]  metoprolol tartrate (LOPRESSOR) 50 MG tablet Take 1 tablet (50 mg total) by mouth 2 (two) times daily. 12/09/22 01/08/23  Briant Cedar, MD  Multiple Vitamin (MULTIVITAMIN PO) Take 1 tablet by mouth in the morning.    [provider]  pantoprazole (PROTONIX) 40 MG tablet Take 1 tablet (40 mg total) by mouth daily. 10/02/22   Rana Snare, DO  potassium chloride SA (KLOR-CON M) 20 MEQ tablet Take 20 mEq by mouth 2 (two) times daily.    [provider]  prazosin (MINIPRESS) 2 MG capsule Take 4 mg by mouth at bedtime.    [provider]  rosuvastatin (CRESTOR) 10 MG tablet Take 1 tablet (10 mg total) by mouth daily. 10/02/22   Rana Snare, DO  traMADol (ULTRAM) 50 MG tablet Take 50 mg by mouth every 6 (six) hours as needed for moderate pain. 12/06/18   [provider]      Allergies    Spironolactone, Hydromorphone hcl, and Oxycodone hcl    Review of Systems   Review of Systems  Constitutional:  Negative for chills, diaphoresis and fever.  HENT:  Negative for sore throat and trouble swallowing.   Eyes:  Negative for redness and visual disturbance.  Respiratory:  Negative for cough and shortness of breath.   Cardiovascular:  Negative for chest pain, palpitations and leg swelling.  Gastrointestinal:  Negative for abdominal pain, diarrhea and vomiting.  Genitourinary:  Negative for dysuria and flank pain.  Musculoskeletal:  Negative for back pain and neck pain.  Skin:  Negative for rash.  Neurological:  Positive for tremors. Negative for speech  difficulty and headaches.  Hematological:  Does not bruise/bleed easily.    Physical Exam Updated Vital Signs BP (!) 180/75   Pulse 77   Temp 98.4 F (36.9 C) (Oral)   Resp 19   Ht 1.753 m (5\' 9" )   Wt 79 kg   SpO2 100%   BMI 25.72 kg/m  Physical Exam Vitals and nursing note reviewed.  Constitutional:      Appearance: Normal appearance. He is well-developed.  HENT:     Head: Atraumatic.     Comments: No sinus or temporal tenderness.     Nose: Nose normal.     Mouth/Throat:     Mouth: Mucous membranes are moist.     Pharynx: Oropharynx is clear.  Eyes:     General: No scleral icterus.    Conjunctiva/sclera: Conjunctivae normal.     Pupils: Pupils are equal, round, and reactive to light.  Neck:     Vascular: No carotid bruit.     Trachea: No tracheal deviation.     Comments: No stiffness or rigidity.  Cardiovascular:     Rate and Rhythm: Normal rate and regular rhythm.     Pulses: Normal pulses.     Heart sounds: Normal heart sounds. No murmur heard.    No friction rub. No gallop.  Pulmonary:     Effort: Pulmonary effort is normal. No accessory muscle usage or respiratory distress.     Breath sounds: Normal breath sounds.  Abdominal:     General: Bowel sounds are normal. There is no distension.     Palpations: Abdomen is soft.     Tenderness: There is no abdominal tenderness.  Genitourinary:    Comments: No cva tenderness. Musculoskeletal:        General: No swelling or tenderness.     Cervical back: Normal range of motion and neck supple. No rigidity or tenderness.     Right lower leg: No edema.     Left lower leg: No edema.     Comments: RUE av fistula w palp thrill.   Skin:    General: Skin is warm and dry.     Findings: No rash.  Neurological:     Mental Status: He is alert.     Comments: Alert, speech clear. Motor/sens grossly intact. No gross tremor currently. Performs bil finger to nose well.  Psychiatric:        Mood and Affect: Mood normal.      ED Results / Procedures / Treatments   Labs (all labs ordered are listed, but only abnormal results are displayed) Results for orders placed or performed during the hospital encounter of 12/27/22  CBC with Differential  Result Value Ref Range   WBC 5.4 4.0 - 10.5 K/uL   RBC 4.24 4.22 - 5.81  MIL/uL   Hemoglobin 11.6 (L) 13.0 - 17.0 g/dL   HCT 16.1 (L) 09.6 - 04.5 %   MCV 83.0 80.0 - 100.0 fL   MCH 27.4 26.0 - 34.0 pg   MCHC 33.0 30.0 - 36.0 g/dL   RDW 40.9 81.1 - 91.4 %   Platelets 215 150 - 400 K/uL   nRBC 0.0 0.0 - 0.2 %   Neutrophils Relative % 75 %   Neutro Abs 3.9 1.7 - 7.7 K/uL   Lymphocytes Relative 16 %   Lymphs Abs 0.9 0.7 - 4.0 K/uL   Monocytes Relative 8 %   Monocytes Absolute 0.4 0.1 - 1.0 K/uL   Eosinophils Relative 1 %   Eosinophils Absolute 0.1 0.0 - 0.5 K/uL   Basophils Relative 0 %   Basophils Absolute 0.0 0.0 - 0.1 K/uL   Immature Granulocytes 0 %   Abs Immature Granulocytes 0.02 0.00 - 0.07 K/uL  Comprehensive metabolic panel  Result Value Ref Range   Sodium 141 135 - 145 mmol/L   Potassium 3.5 3.5 - 5.1 mmol/L   Chloride 102 98 - 111 mmol/L   CO2 29 22 - 32 mmol/L   Glucose, Bld 120 (H) 70 - 99 mg/dL   BUN 18 8 - 23 mg/dL   Creatinine, Ser 7.82 (H) 0.61 - 1.24 mg/dL   Calcium 9.2 8.9 - 95.6 mg/dL   Total Protein 6.0 (L) 6.5 - 8.1 g/dL   Albumin 3.0 (L) 3.5 - 5.0 g/dL   AST 27 15 - 41 U/L   ALT 19 0 - 44 U/L   Alkaline Phosphatase 83 38 - 126 U/L   Total Bilirubin 0.6 0.3 - 1.2 mg/dL   GFR, Estimated 12 (L) >60 mL/min   Anion gap 10 5 - 15  Magnesium  Result Value Ref Range   Magnesium 1.6 (L) 1.7 - 2.4 mg/dL  Urinalysis, Routine w reflex microscopic -Urine, Clean Catch  Result Value Ref Range   Color, Urine YELLOW YELLOW   APPearance CLEAR CLEAR   Specific Gravity, Urine 1.012 1.005 - 1.030   pH 8.0 5.0 - 8.0   Glucose, UA NEGATIVE NEGATIVE mg/dL   Hgb urine dipstick NEGATIVE NEGATIVE   Bilirubin Urine NEGATIVE NEGATIVE   Ketones,  ur NEGATIVE NEGATIVE mg/dL   Protein, ur >=213 (A) NEGATIVE mg/dL   Nitrite NEGATIVE NEGATIVE   Leukocytes,Ua NEGATIVE NEGATIVE   RBC / HPF 0-5 0 - 5 RBC/hpf   WBC, UA 0-5 0 - 5 WBC/hpf   Bacteria, UA RARE (A) NONE SEEN   Squamous Epithelial / HPF 0-5 0 - 5 /HPF   Mucus PRESENT    Hyaline Casts, UA PRESENT    CT Head Wo Contrast  Result Date: 12/27/2022 CLINICAL DATA:  Altered mental status EXAM: CT HEAD WITHOUT CONTRAST TECHNIQUE: Contiguous axial images were obtained from the base of the skull through the vertex without intravenous contrast. RADIATION DOSE REDUCTION: This exam was performed according to the departmental dose-optimization program which includes automated exposure control, adjustment of the mA and/or kV according to patient size and/or use of iterative reconstruction technique. COMPARISON:  CT head 09/03/2018 FINDINGS: Brain: There is no acute intracranial hemorrhage, extra-axial fluid collection, or acute infarct. Parenchymal volume is within normal limits for age. The ventricles are normal in size. Gray-white differentiation is preserved. Patchy hypodensity in the supratentorial white matter likely reflects sequela of underlying chronic small-vessel ischemic change. The pituitary and suprasellar region are normal. There is no mass lesion. There is no mass  effect or midline shift. Vascular: There is calcification of the bilateral carotid siphons. Skull: Normal. Negative for fracture or focal lesion. Sinuses/Orbits: The imaged paranasal sinuses are clear. The globes and orbits are unremarkable. Other: The mastoid air cells and middle ear cavities are clear. IMPRESSION: No acute intracranial pathology. Electronically Signed   By: Lesia Hausen M.D.   On: 12/27/2022 15:59      EKG EKG Interpretation  Date/Time:  Sunday December 27 2022 18:42:02 EDT Ventricular Rate:  62 PR Interval:  162 QRS Duration: 81 QT Interval:  412 QTC Calculation: 419 R Axis:   54 Text Interpretation: Sinus  rhythm Premature ventricular complexes Nonspecific T wave abnormality Confirmed by Cathren Laine ([id number]) on 12/27/2022 6:57:52 PM  Radiology CT Head Wo Contrast  Result Date: 12/27/2022 CLINICAL DATA:  Altered mental status EXAM: CT HEAD WITHOUT CONTRAST TECHNIQUE: Contiguous axial images were obtained from the base of the skull through the vertex without intravenous contrast. RADIATION DOSE REDUCTION: This exam was performed according to the departmental dose-optimization program which includes automated exposure control, adjustment of the mA and/or kV according to patient size and/or use of iterative reconstruction technique. COMPARISON:  CT head 09/03/2018 FINDINGS: Brain: There is no acute intracranial hemorrhage, extra-axial fluid collection, or acute infarct. Parenchymal volume is within normal limits for age. The ventricles are normal in size. Gray-white differentiation is preserved. Patchy hypodensity in the supratentorial white matter likely reflects sequela of underlying chronic small-vessel ischemic change. The pituitary and suprasellar region are normal. There is no mass lesion. There is no mass effect or midline shift. Vascular: There is calcification of the bilateral carotid siphons. Skull: Normal. Negative for fracture or focal lesion. Sinuses/Orbits: The imaged paranasal sinuses are clear. The globes and orbits are unremarkable. Other: The mastoid air cells and middle ear cavities are clear. IMPRESSION: No acute intracranial pathology. Electronically Signed   By: Lesia Hausen M.D.   On: 12/27/2022 15:59    Procedures Procedures    Medications Ordered in ED Medications  propranolol (INDERAL) tablet 10 mg (has no administration in time range)  magnesium sulfate IVPB 1 g 100 mL (0 g Intravenous Stopped 12/27/22 1842)  lactated ringers bolus 500 mL (0 mLs Intravenous Stopped 12/27/22 1842)    ED Course/ Medical Decision Making/ A&P                             Medical Decision  Making Problems Addressed: Elevated blood pressure reading: acute illness or injury ESRD on dialysis [facility]): chronic illness or injury with exacerbation, progression, or side effects of treatment that poses a threat to life or bodily functions Essential hypertension: chronic illness or injury with exacerbation, progression, or side effects of treatment that poses a threat to life or bodily functions Hypomagnesemia: acute illness or injury    Details: Acute/chronic Tremor: acute illness or injury with systemic symptoms  Amount and/or Complexity of Data Reviewed Independent Historian:     Details: Family, hx External Data Reviewed: labs, radiology and notes. Labs: ordered. Decision-making details documented in ED Course. Radiology: ordered and independent interpretation performed. Decision-making details documented in ED Course. ECG/medicine tests: ordered and independent interpretation performed. Decision-making details documented in ED Course. Discussion of management or test interpretation with external provider(s): Neurology, discussed pt  Risk Prescription drug management. Decision regarding hospitalization.   Iv ns. Continuous pulse ox and cardiac monitoring. Labs ordered/sent. Imaging ordered.   Differential diagnosis includes tremor, weakness, electrolyte abn, etc. Dispo  decision including potential need for admission considered - will get labs and imaging and reassess.   Reviewed nursing notes and prior charts for additional history. External reports reviewed. Additional history from: family.   Cardiac monitor: sinus rhythm, rate 68.  Labs reviewed/interpreted by me - mg sl low. Mg 1 gm iv.  Other chem normal. Wbc normal. UA pending.   CT reviewed/interpreted by me - no hem.  Additional labs reviewed/interpreted by me - ua  Neurology consulted re tremors. Discussed pt with Dr Derry Lory - he recommends adding labs and outpatient f/u with Dr Tat. Discussed plan w pt/family -  agreeable.  Labs added (note pt recently had tsh/t4).     No current tremor/shakes.   Pt currently appears stable for d/c.   Rec close pcp/neurology f/u.  Return precautions provided.          Final Clinical Impression(s) / ED Diagnoses Final diagnoses:  ESRD on dialysis [facility])  Tremor  Essential hypertension  Hypomagnesemia  Elevated blood pressure reading    Rx / DC Orders ED Discharge Orders          Ordered    propranolol (INDERAL) 10 MG tablet  Daily PRN        12/27/22 1833               Cathren Laine, MD 12/27/22 1859

## 2022-12-27 NOTE — ED Triage Notes (Signed)
Per [facility] pt coming from home c/o tremors x 2 weeks. Last dialysis treatment on Friday. Reports difficulty talking when tremors are too intense. Was here 2 weeks ago for stomach issues.

## 2022-12-29 LAB — CERULOPLASMIN: Ceruloplasmin: 27 mg/dL (ref 16.0–31.0)

## 2022-12-30 LAB — HEAVY METALS, BLOOD
Arsenic: 3 ug/L (ref 0–9)
Lead: 3.1 ug/dL (ref 0.0–3.4)
Mercury: 1.6 ug/L (ref 0.0–14.9)

## 2022-12-30 LAB — COPPER, SERUM: Copper: 126 ug/dL (ref 69–132)

## 2022-12-31 ENCOUNTER — Telehealth: Payer: Self-pay

## 2022-12-31 NOTE — Telephone Encounter (Signed)
Transition Care Management Follow-up Telephone Call Date of discharge and from where: 12/27/2022 [facility] How have you been since you were released from the hospital? Patient is feeling better Any questions or concerns? No  Items Reviewed: Did the pt receive and understand the discharge instructions provided? Yes  Medications obtained and verified? Yes  Other? No  Any new allergies since your discharge? No  Dietary orders reviewed? Yes Do you have support at home? Yes   Follow up appointments reviewed:  PCP Hospital f/u appt confirmed?  [facility]  Scheduled to see  on  @ . Specialist Hospital f/u appt confirmed?  [facility]   Scheduled to see  on  @ . Are transportation arrangements needed? No  If their condition worsens, is the pt aware to call PCP or go to the Emergency Dept.? Yes Was the patient provided with contact information for the PCP's office or ED? Yes Was to pt encouraged to call back with questions or concerns? Yes  Karl Robinson [facility]  [facility] Population Health Community Resource Care Guide   [email or web address]  ?? [phone]   Website: [email or web address]  [email or web address]

## 2024-08-25 ENCOUNTER — Telehealth (HOSPITAL_COMMUNITY): Payer: Self-pay

## 2024-08-25 NOTE — Telephone Encounter (Signed)
 Outside/paper referral received by Dr. Harlene from [facility]. Will fax over request for EKG if patient confirms interest. Insurance benefits and eligibility to be determined.   Attempted to call patient regarding cardiac rehab- no answer, left message. Mailed letter.
